# Patient Record
Sex: Female | Born: 1975 | Race: Black or African American | Hispanic: No | Smoking: Never smoker
Health system: Southern US, Community
[De-identification: ages and names within clinical notes are randomized; demographics above are authoritative.]

## PROBLEM LIST (undated history)

## (undated) DIAGNOSIS — T7840XA Allergy, unspecified, initial encounter: Secondary | ICD-10-CM

## (undated) DIAGNOSIS — E119 Type 2 diabetes mellitus without complications: Secondary | ICD-10-CM

## (undated) HISTORY — DX: Type 2 diabetes mellitus without complications: E11.9

## (undated) HISTORY — PX: TUBAL LIGATION: SHX77

## (undated) HISTORY — PX: ABLATION: SHX5711

## (undated) HISTORY — DX: Allergy, unspecified, initial encounter: T78.40XA

---

## 2005-01-18 ENCOUNTER — Other Ambulatory Visit: Admission: RE | Admit: 2005-01-18 | Discharge: 2005-01-18 | Payer: Self-pay

## 2006-01-03 ENCOUNTER — Encounter (INDEPENDENT_AMBULATORY_CARE_PROVIDER_SITE_OTHER): Payer: Self-pay | Admitting: Specialist

## 2006-01-03 ENCOUNTER — Other Ambulatory Visit: Admission: RE | Admit: 2006-01-03 | Discharge: 2006-01-03 | Payer: Self-pay | Admitting: Unknown Physician Specialty

## 2009-05-13 ENCOUNTER — Ambulatory Visit (HOSPITAL_COMMUNITY): Admission: RE | Admit: 2009-05-13 | Discharge: 2009-05-13 | Payer: Self-pay | Admitting: Obstetrics & Gynecology

## 2010-04-21 LAB — URINALYSIS, MICROSCOPIC ONLY
Bilirubin Urine: NEGATIVE
Hgb urine dipstick: NEGATIVE
Ketones, ur: NEGATIVE mg/dL
Protein, ur: NEGATIVE mg/dL
Urobilinogen, UA: 0.2 mg/dL (ref 0.0–1.0)

## 2010-04-21 LAB — COMPREHENSIVE METABOLIC PANEL
AST: 15 U/L (ref 0–37)
Albumin: 4 g/dL (ref 3.5–5.2)
Alkaline Phosphatase: 50 U/L (ref 39–117)
BUN: 12 mg/dL (ref 6–23)
CO2: 23 mEq/L (ref 19–32)
Chloride: 107 mEq/L (ref 96–112)
GFR calc Af Amer: >60 mL/min (ref >60)
Potassium: 3.8 mEq/L (ref 3.5–5.1)
Total Bilirubin: 0.4 mg/dL (ref 0.3–1.2)

## 2010-04-21 LAB — CBC
HCT: 36.6 % (ref 36.0–46.0)
Platelets: 282 10*3/uL (ref 150–400)
RBC: 4.36 MIL/uL (ref 3.87–5.11)
WBC: 9.4 10*3/uL (ref 4.0–10.5)

## 2010-04-21 LAB — DIFFERENTIAL
Basophils Absolute: 0 10*3/uL (ref 0.0–0.1)
Basophils Relative: 1 % (ref 0–1)
Eosinophils Absolute: 0 10*3/uL (ref 0.0–0.7)
Eosinophils Relative: 1 % (ref 0–5)
Monocytes Absolute: 0.5 10*3/uL (ref 0.1–1.0)

## 2010-04-21 LAB — GLUCOSE, CAPILLARY

## 2014-05-19 ENCOUNTER — Other Ambulatory Visit: Payer: Self-pay | Admitting: Women's Health

## 2017-10-27 DIAGNOSIS — E1140 Type 2 diabetes mellitus with diabetic neuropathy, unspecified: Secondary | ICD-10-CM | POA: Diagnosis not present

## 2017-10-27 DIAGNOSIS — R350 Frequency of micturition: Secondary | ICD-10-CM | POA: Diagnosis not present

## 2017-10-27 DIAGNOSIS — Z789 Other specified health status: Secondary | ICD-10-CM | POA: Diagnosis not present

## 2017-10-27 DIAGNOSIS — I10 Essential (primary) hypertension: Secondary | ICD-10-CM | POA: Diagnosis not present

## 2017-10-27 DIAGNOSIS — E1165 Type 2 diabetes mellitus with hyperglycemia: Secondary | ICD-10-CM | POA: Diagnosis not present

## 2017-10-27 DIAGNOSIS — Z299 Encounter for prophylactic measures, unspecified: Secondary | ICD-10-CM | POA: Diagnosis not present

## 2017-10-27 DIAGNOSIS — Z6827 Body mass index (BMI) 27.0-27.9, adult: Secondary | ICD-10-CM | POA: Diagnosis not present

## 2017-11-06 DIAGNOSIS — Z299 Encounter for prophylactic measures, unspecified: Secondary | ICD-10-CM | POA: Diagnosis not present

## 2017-11-06 DIAGNOSIS — I10 Essential (primary) hypertension: Secondary | ICD-10-CM | POA: Diagnosis not present

## 2017-11-06 DIAGNOSIS — Z6827 Body mass index (BMI) 27.0-27.9, adult: Secondary | ICD-10-CM | POA: Diagnosis not present

## 2017-11-06 DIAGNOSIS — E1165 Type 2 diabetes mellitus with hyperglycemia: Secondary | ICD-10-CM | POA: Diagnosis not present

## 2017-11-06 DIAGNOSIS — Z713 Dietary counseling and surveillance: Secondary | ICD-10-CM | POA: Diagnosis not present

## 2018-01-04 DIAGNOSIS — Z299 Encounter for prophylactic measures, unspecified: Secondary | ICD-10-CM | POA: Diagnosis not present

## 2018-01-04 DIAGNOSIS — E1165 Type 2 diabetes mellitus with hyperglycemia: Secondary | ICD-10-CM | POA: Diagnosis not present

## 2018-01-04 DIAGNOSIS — Z6827 Body mass index (BMI) 27.0-27.9, adult: Secondary | ICD-10-CM | POA: Diagnosis not present

## 2018-01-04 DIAGNOSIS — Z01419 Encounter for gynecological examination (general) (routine) without abnormal findings: Secondary | ICD-10-CM | POA: Diagnosis not present

## 2018-01-04 DIAGNOSIS — Z1331 Encounter for screening for depression: Secondary | ICD-10-CM | POA: Diagnosis not present

## 2018-01-04 DIAGNOSIS — I10 Essential (primary) hypertension: Secondary | ICD-10-CM | POA: Diagnosis not present

## 2018-01-04 DIAGNOSIS — Z0000 Encounter for general adult medical examination without abnormal findings: Secondary | ICD-10-CM | POA: Diagnosis not present

## 2018-01-04 DIAGNOSIS — Z79899 Other long term (current) drug therapy: Secondary | ICD-10-CM | POA: Diagnosis not present

## 2018-11-12 LAB — COMPREHENSIVE METABOLIC PANEL
GFR calc Af Amer: >60
GFR calc non Af Amer: >60

## 2018-11-13 LAB — BASIC METABOLIC PANEL
BUN: 16 (ref 4–21)
Chloride: 100 (ref 99–108)
Creatinine: 0.6 (ref <=1.1)
Glucose: 298
Potassium: 4.2 (ref 3.4–5.3)
Sodium: 133 — AB (ref 137–147)

## 2018-11-13 LAB — COMPREHENSIVE METABOLIC PANEL
Albumin: 4.4 (ref 3.5–5.0)
Calcium: 9.6 (ref 8.7–10.7)

## 2018-11-13 LAB — CBC AND DIFFERENTIAL
HCT: 43 (ref 36–46)
Hemoglobin: 13.8 (ref 12.0–16.0)
Neutrophils Absolute: 4
WBC: 6.8

## 2018-11-13 LAB — HEPATIC FUNCTION PANEL
ALT: 15 (ref 7–35)
AST: 12 — AB (ref 13–35)

## 2018-12-13 ENCOUNTER — Ambulatory Visit (INDEPENDENT_AMBULATORY_CARE_PROVIDER_SITE_OTHER): Payer: BC Managed Care – PPO | Admitting: Family Medicine

## 2018-12-13 ENCOUNTER — Telehealth: Payer: Self-pay

## 2018-12-13 ENCOUNTER — Encounter: Payer: Self-pay | Admitting: Family Medicine

## 2018-12-13 ENCOUNTER — Other Ambulatory Visit: Payer: Self-pay

## 2018-12-13 VITALS — BP 120/82 | HR 92 | Temp 98.4°F | Ht 66.0 in | Wt 166.2 lb

## 2018-12-13 DIAGNOSIS — E079 Disorder of thyroid, unspecified: Secondary | ICD-10-CM | POA: Diagnosis not present

## 2018-12-13 DIAGNOSIS — E118 Type 2 diabetes mellitus with unspecified complications: Secondary | ICD-10-CM

## 2018-12-13 DIAGNOSIS — IMO0002 Reserved for concepts with insufficient information to code with codable children: Secondary | ICD-10-CM | POA: Insufficient documentation

## 2018-12-13 DIAGNOSIS — Z794 Long term (current) use of insulin: Secondary | ICD-10-CM

## 2018-12-13 DIAGNOSIS — Z7689 Persons encountering health services in other specified circumstances: Secondary | ICD-10-CM | POA: Insufficient documentation

## 2018-12-13 DIAGNOSIS — E1165 Type 2 diabetes mellitus with hyperglycemia: Secondary | ICD-10-CM | POA: Insufficient documentation

## 2018-12-13 DIAGNOSIS — Z1231 Encounter for screening mammogram for malignant neoplasm of breast: Secondary | ICD-10-CM

## 2018-12-13 LAB — POCT URINALYSIS DIP (CLINITEK)
Bilirubin, UA: NEGATIVE
Blood, UA: NEGATIVE
Glucose, UA: 500 mg/dL — AB
Leukocytes, UA: NEGATIVE
Nitrite, UA: NEGATIVE
POC PROTEIN,UA: NEGATIVE
Spec Grav, UA: 1.025 (ref 1.010–1.025)
Urobilinogen, UA: 0.2 E.U./dL
pH, UA: 5 (ref 5.0–8.0)

## 2018-12-13 LAB — POCT GLYCOSYLATED HEMOGLOBIN (HGB A1C): Hemoglobin A1C: 12.1 % — AB (ref 4.0–5.6)

## 2018-12-13 MED ORDER — VALACYCLOVIR HCL 500 MG PO TABS: 500.0000 mg | ORAL_TABLET | Freq: Every day | ORAL | 11 refills | Status: DC

## 2018-12-13 MED ORDER — BLOOD GLUCOSE METER KIT: PACK | 5 refills | Status: AC

## 2018-12-13 NOTE — Patient Instructions (Addendum)
Endo referral Diabetic education referral Check glucose before meals, fasting and at bedtime mammogram

## 2018-12-13 NOTE — Progress Notes (Signed)
New Patient Office Visit  Subjective:  Patient ID: Brandi Oneill, female    DOB: 1975/10/23  Age: 43 y.o. MRN: [id number]  CC:  Chief Complaint  Patient presents with  . Establish Care  . Thyroid Problem  . Diabetes    HPI Brandi Oneill presents for UTI concern-diagnosis caused elevation of glucose at hospital-records unavailable Glucose elevated at [facility] Hyperthyroid concerning -recommended endo at hospital Diagnosis of DM-taking insulin daily-unable to tolerate other types of diabetic medication H/o genital herpes -would like daily medication  Past Medical History:  Diagnosis Date  . Allergy   . Diabetes mellitus without complication (HCC)     Family History  Problem Relation Age of Onset  . Diabetes Mother   . Diabetes Father   . Hypertension Father     Social History   Socioeconomic History  . Marital status: Single    Spouse name: Not on file  . Number of children: Not on file  . Years of education: Not on file  . Highest education level: Not on file  Occupational History  . Occupation: shipping & receiving  Social Needs  . Financial resource strain: Not on file  . Food insecurity    Worry: Not on file    Inability: Not on file  . Transportation needs    Medical: Not on file    Non-medical: Not on file  Tobacco Use  . Smoking status: Never Smoker  . Smokeless tobacco: Never Used  Substance and Sexual Activity  . Alcohol use: Not Currently  . Drug use: Not Currently  . Sexual activity: Yes  Lifestyle  . Physical activity    Days per week: Not on file    Minutes per session: Not on file  . Stress: Not on file  Relationships  . Social Musician on phone: Not on file    Gets together: Not on file    Attends religious service: Not on file    Active member of club or organization: Not on file    Attends meetings of clubs or organizations: Not on file    Relationship status: Not on file  . Intimate partner violence   Fear of current or ex partner: Not on file    Emotionally abused: Not on file    Physically abused: Not on file    Forced sexual activity: Not on file  Other Topics Concern  . Not on file  Social History Narrative  . Not on file    ROS Review of Systems  Constitutional: Positive for unexpected weight change.       On Victoza -rapid weight loss-  HENT: Negative.   Respiratory: Negative.   Cardiovascular: Positive for leg swelling.  Gastrointestinal: Positive for constipation.  Endocrine: Positive for cold intolerance, heat intolerance and polyphagia.       Difficulty regulating diabetes-using Lispro Kwik pen-takes 50units at bedtime  Pt was unable to tolerate metformin related diarrhea  Thyroid disease-unknown levels  Genitourinary: Positive for frequency.  Musculoskeletal: Positive for back pain.  Skin: Negative.        Episodic genital herpes-pt would like suppressive therapy  Allergic/Immunologic: Positive for environmental allergies.  Neurological: Negative for headaches.  Hematological: Negative.   Psychiatric/Behavioral: Negative.     Objective:   Today's Vitals: BP 120/82 (BP Location: Right Arm, Patient Position: Sitting, Cuff Size: Normal)   Pulse 92   Temp 98.4 F (36.9 C) (Oral)   Ht 5\' 6"  (1.676 m)  Wt 166 lb 3.2 oz (75.4 kg)   SpO2 96%   BMI 26.83 kg/m   Physical Exam Vitals signs reviewed.  Constitutional:      Appearance: Normal appearance.  HENT:     Head: Normocephalic and atraumatic.     Right Ear: Tympanic membrane, ear canal and external ear normal.     Left Ear: Tympanic membrane, ear canal and external ear normal.     Nose: Nose normal.     Mouth/Throat:     Mouth: Mucous membranes are moist.  Eyes:     Conjunctiva/sclera: Conjunctivae normal.  Neck:     Musculoskeletal: Normal range of motion and neck supple.  Cardiovascular:     Rate and Rhythm: Normal rate and regular rhythm.     Pulses: Normal pulses.     Heart sounds: Normal  heart sounds.  Pulmonary:     Effort: Pulmonary effort is normal.     Breath sounds: Normal breath sounds.  Abdominal:     General: Bowel sounds are normal.  Neurological:     Mental Status: She is alert and oriented to person, place, and time.  Psychiatric:        Behavior: Behavior normal.     Assessment & Plan:   1. Controlled type 2 diabetes mellitus with complication, with long-term current use of insulin (HCC) Pt was unable to tolerate metformin due to diarrhea and Victoza due to rapid weight loss. Pt would like referral for nutritional assistance and endo for management - Ambulatory referral to Endocrinology 1. Controlled type 2 diabetes mellitus with complication, with long-term current use of insulin (HCC) Glucose monitoring suggested ac meals and bedtime-pt has a monitor-encouraged her to check insurance for monitor of choice to decrease cost - Ambulatory referral to Endocrinology - Referral to Nutrition and Diabetes Services - POCT URINALYSIS DIP (CLINITEK) - Lipid panel - POCT HgB A1C  2. Thyroid disease Endo-no records available from hospital-concern for rapid weight loss in the past-labs pending - COMPLETE METABOLIC PANEL WITH GFR - TSH+T4F+T3Free  3. Breast cancer screening by mammogram - MM Digital Screening; Future Outpatient Encounter Medications as of 12/13/2018  Medication Sig  . insulin lispro (HUMALOG) 100 UNIT/ML injection Inject into the skin 3 (three) times daily before meals.  . metFORMIN (GLUCOPHAGE) 500 MG tablet Take by mouth 2 (two) times daily with a meal.   No facility-administered encounter medications on file as of 12/13/2018.   Marland Kitchendai  Follow-up: 1 month-after labwork, endo appt  LISA Hannah Beat, MD

## 2018-12-13 NOTE — Telephone Encounter (Signed)
Brandi Oneill, CMA  

## 2018-12-25 ENCOUNTER — Other Ambulatory Visit (HOSPITAL_COMMUNITY): Payer: Self-pay | Admitting: Family Medicine

## 2018-12-25 DIAGNOSIS — Z1231 Encounter for screening mammogram for malignant neoplasm of breast: Secondary | ICD-10-CM

## 2018-12-31 ENCOUNTER — Other Ambulatory Visit: Payer: Self-pay

## 2018-12-31 ENCOUNTER — Ambulatory Visit (HOSPITAL_COMMUNITY)
Admission: RE | Admit: 2018-12-31 | Discharge: 2018-12-31 | Disposition: A | Discharge status: Home / Self Care | Payer: BC Managed Care – PPO | Source: Ambulatory Visit | Attending: Family Medicine | Admitting: Family Medicine

## 2018-12-31 DIAGNOSIS — Z1231 Encounter for screening mammogram for malignant neoplasm of breast: Secondary | ICD-10-CM | POA: Diagnosis not present

## 2019-01-01 ENCOUNTER — Other Ambulatory Visit (HOSPITAL_COMMUNITY): Payer: Self-pay | Admitting: Family Medicine

## 2019-01-01 DIAGNOSIS — R928 Other abnormal and inconclusive findings on diagnostic imaging of breast: Secondary | ICD-10-CM

## 2019-01-04 ENCOUNTER — Telehealth: Payer: Self-pay | Admitting: Family Medicine

## 2019-01-04 NOTE — Telephone Encounter (Signed)
Pt is calling and states she is having severe back pain and missed work today. Patient would like advice if this is something dr Holly Bodily can treat or does she need to be referred out.

## 2019-01-07 ENCOUNTER — Ambulatory Visit: Payer: Self-pay | Admitting: "Endocrinology

## 2019-01-07 NOTE — Telephone Encounter (Signed)
Routing to Dr. Corum for advice ? 

## 2019-01-07 NOTE — Telephone Encounter (Signed)
Tele-med visit to discuss

## 2019-01-08 ENCOUNTER — Telehealth (INDEPENDENT_AMBULATORY_CARE_PROVIDER_SITE_OTHER): Payer: BC Managed Care – PPO | Admitting: Family Medicine

## 2019-01-08 ENCOUNTER — Encounter: Payer: Self-pay | Admitting: Family Medicine

## 2019-01-08 ENCOUNTER — Other Ambulatory Visit: Payer: Self-pay

## 2019-01-08 ENCOUNTER — Ambulatory Visit (HOSPITAL_COMMUNITY)
Admission: RE | Admit: 2019-01-08 | Discharge: 2019-01-08 | Disposition: A | Discharge status: Home / Self Care | Payer: BC Managed Care – PPO | Source: Ambulatory Visit | Attending: Family Medicine | Admitting: Family Medicine

## 2019-01-08 ENCOUNTER — Other Ambulatory Visit (HOSPITAL_COMMUNITY): Payer: BC Managed Care – PPO

## 2019-01-08 DIAGNOSIS — M545 Low back pain, unspecified: Secondary | ICD-10-CM

## 2019-01-08 DIAGNOSIS — G8929 Other chronic pain: Secondary | ICD-10-CM | POA: Diagnosis not present

## 2019-01-08 DIAGNOSIS — R928 Other abnormal and inconclusive findings on diagnostic imaging of breast: Secondary | ICD-10-CM | POA: Insufficient documentation

## 2019-01-08 DIAGNOSIS — N6320 Unspecified lump in the left breast, unspecified quadrant: Secondary | ICD-10-CM | POA: Diagnosis not present

## 2019-01-08 MED ORDER — CYCLOBENZAPRINE HCL 10 MG PO TABS: 10.0000 mg | ORAL_TABLET | Freq: Every day | ORAL | 5 refills | Status: DC

## 2019-01-08 NOTE — Patient Instructions (Signed)
Voltaren gel Pick up flexeril at [place] twice a day with food Lumbar xrays -order sent to [facility]

## 2019-01-08 NOTE — Progress Notes (Signed)
Virtual Visit via Telephone Note  I connected with Brandi Oneill on 01/08/19 at  1:40 PM EST by telephone and verified that I am speaking with the correct person using two identifiers.  Location: Patient: home Provider: clinic   I discussed the limitations, risks, security and privacy concerns of performing an evaluation and management service by telephone and the availability of in person appointments. I also discussed with the patient that there may be a patient responsible charge related to this service. The patient expressed understanding and agreed to proceed.   History of Present Illness: Pt with lower back pain episodically for 10 years. Pt states no prior evaluation. No radiation. 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 Pt states she did a bearhug lift and pt state she hurt her back.  Pt states walking affected. Pt with no bathroom concerns.  Pt has taken Aleve and used a heating pad.  Pt used biofreeze.    Observations/Objective:  No current vitals Assessment and Plan:  1. Chronic midline low back pain without sciatica Recommend xray for evaluation since no prior xrays of lower back and worsening symptoms - DG Lumbar Spine Complete; Future Flexeril-rx voltaren gel Aleve-with food Follow Up Instructions: Will call with results of xray   I discussed the assessment and treatment plan with the patient. The patient was provided an opportunity to ask questions and all were answered. The patient agreed with the plan and demonstrated an understanding of the instructions.   The patient was advised to call back or seek an in-person evaluation if the symptoms worsen or if the condition fails to improve as anticipated.  I provided 10. minutes of non-face-to-face time during this encounter.   LISA Hannah Beat, MD

## 2019-01-08 NOTE — Telephone Encounter (Signed)
Routing to [employer] for a Telemed visit

## 2019-01-10 ENCOUNTER — Other Ambulatory Visit: Payer: Self-pay

## 2019-01-10 ENCOUNTER — Ambulatory Visit (HOSPITAL_COMMUNITY)
Admission: RE | Admit: 2019-01-10 | Discharge: 2019-01-10 | Disposition: A | Discharge status: Home / Self Care | Payer: BC Managed Care – PPO | Source: Ambulatory Visit | Attending: Family Medicine | Admitting: Family Medicine

## 2019-01-10 DIAGNOSIS — G8929 Other chronic pain: Secondary | ICD-10-CM | POA: Diagnosis not present

## 2019-01-10 DIAGNOSIS — M545 Low back pain, unspecified: Secondary | ICD-10-CM

## 2019-01-13 DIAGNOSIS — G8929 Other chronic pain: Secondary | ICD-10-CM | POA: Insufficient documentation

## 2019-01-13 DIAGNOSIS — M545 Low back pain: Secondary | ICD-10-CM | POA: Insufficient documentation

## 2019-01-14 ENCOUNTER — Other Ambulatory Visit: Payer: Self-pay

## 2019-01-14 ENCOUNTER — Telehealth: Payer: Self-pay | Admitting: Family Medicine

## 2019-01-14 ENCOUNTER — Telehealth (INDEPENDENT_AMBULATORY_CARE_PROVIDER_SITE_OTHER): Payer: BC Managed Care – PPO | Admitting: Family Medicine

## 2019-01-14 DIAGNOSIS — E079 Disorder of thyroid, unspecified: Secondary | ICD-10-CM

## 2019-01-14 DIAGNOSIS — E118 Type 2 diabetes mellitus with unspecified complications: Secondary | ICD-10-CM

## 2019-01-14 DIAGNOSIS — Z794 Long term (current) use of insulin: Secondary | ICD-10-CM

## 2019-01-14 NOTE — Telephone Encounter (Signed)
Routing to Dr. Corum 

## 2019-01-14 NOTE — Progress Notes (Signed)
Virtual Visit via Telephone Note  I connected with Brandi Oneill on 01/14/19 at  2:40 PM EST by telephone and verified that I am speaking with the correct person using two identifiers. DOB/address  Location: Patient: home Provider: clinic   I discussed the limitations, risks, security and privacy concerns of performing an evaluation and management service by telephone and the availability of in person appointments. I also discussed with the patient that there may be a patient responsible charge related to this service. The patient expressed understanding and agreed to proceed.   History of Present Illness:   DM-difficulty keeping glucose under control.  Pt taking metformin BID and Humalog.   Observations/Objective: Glucose 157, 67-fasting at home  Assessment and Plan: 1. Controlled type 2 diabetes mellitus with complication, with long-term current use of insulin (HCC)-poor controlled with A1c-needs endocrinologist and dietitian. Fasting glucose readings  2. Thyroid disease Needs labwork-TSH  Follow Up Instructions: Endo/diabetic education   I discussed the assessment and treatment plan with the patient. The patient was provided an opportunity to ask questions and all were answered. The patient agreed with the plan and demonstrated an understanding of the instructions.   The patient was advised of uncontrolled diabetes and need for referral to endo. Continue glucose reading fasting and prior to meals  I provided 10 minutes of non-face-to-face time during this encounter.   Emari Hreha Hannah Beat, MD

## 2019-01-14 NOTE — Telephone Encounter (Signed)
Pt is calling to let her doctor office know she tested positive for covid on Friday. She has a phone visit today for her 1 month follow up.

## 2019-01-28 ENCOUNTER — Ambulatory Visit: Payer: BC Managed Care – PPO | Admitting: Nutrition

## 2019-02-04 ENCOUNTER — Encounter: Payer: Self-pay | Admitting: Nutrition

## 2019-02-04 ENCOUNTER — Other Ambulatory Visit: Payer: Self-pay

## 2019-02-04 ENCOUNTER — Encounter: Payer: BC Managed Care – PPO | Attending: Family Medicine | Admitting: Nutrition

## 2019-02-04 ENCOUNTER — Ambulatory Visit (INDEPENDENT_AMBULATORY_CARE_PROVIDER_SITE_OTHER): Payer: BC Managed Care – PPO | Admitting: "Endocrinology

## 2019-02-04 ENCOUNTER — Encounter: Payer: Self-pay | Admitting: "Endocrinology

## 2019-02-04 VITALS — BP 137/92 | HR 102 | Ht 66.0 in | Wt 168.8 lb

## 2019-02-04 VITALS — Ht 66.0 in | Wt 168.8 lb

## 2019-02-04 DIAGNOSIS — E118 Type 2 diabetes mellitus with unspecified complications: Secondary | ICD-10-CM

## 2019-02-04 DIAGNOSIS — I10 Essential (primary) hypertension: Secondary | ICD-10-CM | POA: Diagnosis not present

## 2019-02-04 DIAGNOSIS — Z794 Long term (current) use of insulin: Secondary | ICD-10-CM | POA: Diagnosis not present

## 2019-02-04 DIAGNOSIS — E1165 Type 2 diabetes mellitus with hyperglycemia: Secondary | ICD-10-CM | POA: Diagnosis not present

## 2019-02-04 DIAGNOSIS — IMO0002 Reserved for concepts with insufficient information to code with codable children: Secondary | ICD-10-CM

## 2019-02-04 MED ORDER — TRESIBA FLEXTOUCH 100 UNIT/ML ~~LOC~~ SOPN: 60.0000 [IU] | PEN_INJECTOR | Freq: Every day | SUBCUTANEOUS | 2 refills | Status: DC

## 2019-02-04 MED ORDER — METFORMIN HCL ER 500 MG PO TB24: 500.0000 mg | ORAL_TABLET | Freq: Every day | ORAL | 3 refills | Status: DC

## 2019-02-04 NOTE — Progress Notes (Signed)
Endocrinology Consult Note       02/04/2019, 6:31 PM   Subjective:    Patient ID: Brandi Oneill, female    DOB: 07-Sep-1975.  Brandi Oneill is being seen in consultation for management of currently uncontrolled symptomatic diabetes requested by  Maryruth Hancock, MD.   Past Medical History:  Diagnosis Date  . Allergy   . Diabetes mellitus without complication ([place])     History reviewed. No pertinent surgical history.  Social History   Socioeconomic History  . Marital status: Single    Spouse name: Not on file  . Number of children: Not on file  . Years of education: Not on file  . Highest education level: Not on file  Occupational History  . Occupation: shipping & receiving  Tobacco Use  . Smoking status: Never Smoker  . Smokeless tobacco: Never Used  Substance and Sexual Activity  . Alcohol use: Not Currently  . Drug use: Not Currently  . Sexual activity: Yes  Other Topics Concern  . Not on file  Social History Narrative  . Not on file   Social Determinants of Health   Financial Resource Strain:   . Difficulty of Paying Living Expenses: Not on file  Food Insecurity:   . Worried About Charity fundraiser in the Last Year: Not on file  . Ran Out of Food in the Last Year: Not on file  Transportation Needs:   . Lack of Transportation (Medical): Not on file  . Lack of Transportation (Non-Medical): Not on file  Physical Activity:   . Days of Exercise per Week: Not on file  . Minutes of Exercise per Session: Not on file  Stress:   . Feeling of Stress : Not on file  Social Connections:   . Frequency of Communication with Friends and Family: Not on file  . Frequency of Social Gatherings with Friends and Family: Not on file  . Attends Religious Services: Not on file  . Active Member of Clubs or Organizations: Not on file  . Attends Archivist Meetings: Not on file  . Marital  Status: Not on file    Family History  Problem Relation Age of Onset  . Diabetes Mother   . Diabetes Father   . Hypertension Father     Outpatient Encounter Medications as of 02/04/2019  Medication Sig  . blood glucose meter kit and supplies Check blood glucose fasting, before lunch, before dinner and at bedtime  . cyclobenzaprine (FLEXERIL) 10 MG tablet Take 1 tablet (10 mg total) by mouth at bedtime.  . insulin degludec (TRESIBA FLEXTOUCH) 100 UNIT/ML SOPN FlexTouch Pen Inject 0.6 mLs (60 Units total) into the skin daily.  . insulin lispro (HUMALOG) 100 UNIT/ML injection Inject 1 Units into the skin 3 (three) times daily before meals.  . metFORMIN (GLUCOPHAGE XR) 500 MG 24 hr tablet Take 1 tablet (500 mg total) by mouth daily with breakfast.  . valACYclovir (VALTREX) 500 MG tablet Take 1 tablet (500 mg total) by mouth daily.  . [DISCONTINUED] metFORMIN (GLUCOPHAGE) 500 MG tablet Take by mouth 2 (two) times daily with a meal.  No facility-administered encounter medications on file as of 02/04/2019.    ALLERGIES: Not on File  VACCINATION STATUS:  There is no immunization history on file for this patient.  Diabetes She presents for her initial diabetic visit. She has type 2 diabetes mellitus. Onset time: She was diagnosed at approximate age of. Her disease course has been worsening. There are no hypoglycemic associated symptoms. Pertinent negatives for hypoglycemia include no confusion, headaches, pallor or seizures. Associated symptoms include fatigue, polydipsia and polyuria. Pertinent negatives for diabetes include no chest pain and no polyphagia. There are no hypoglycemic complications. Symptoms are worsening. There are no diabetic complications. Risk factors for coronary artery disease include diabetes mellitus and family history. Current diabetic treatments: She is currently on Humalog 15 units 3 times daily AC. Her weight is fluctuating minimally. She is following a generally  unhealthy diet. When asked about meal planning, she reported none. She has not had a previous visit with a dietitian. She participates in exercise intermittently. Her home blood glucose trend is increasing steadily. (She brought in a meter showing average blood glucose of 204 for the last 14 days.  Her recent A1c was 12.1%.) An ACE inhibitor/angiotensin II receptor blocker is not being taken. Eye exam is not current.     Review of Systems  Constitutional: Positive for fatigue. Negative for chills, fever and unexpected weight change.  HENT: Negative for trouble swallowing and voice change.   Eyes: Negative for visual disturbance.  Respiratory: Negative for cough, shortness of breath and wheezing.   Cardiovascular: Negative for chest pain, palpitations and leg swelling.  Gastrointestinal: Negative for diarrhea, nausea and vomiting.  Endocrine: Positive for polydipsia and polyuria. Negative for cold intolerance, heat intolerance and polyphagia.  Musculoskeletal: Negative for arthralgias and myalgias.  Skin: Negative for color change, pallor, rash and wound.  Neurological: Negative for seizures and headaches.  Psychiatric/Behavioral: Negative for confusion and suicidal ideas.    Objective:    BP (!) 137/92   Pulse (!) 102   Ht 5' 6"  (1.676 m)   Wt 168 lb 12.8 oz (76.6 kg)   BMI 27.25 kg/m   Wt Readings from Last 3 Encounters:  02/04/19 168 lb 12.8 oz (76.6 kg)  02/04/19 168 lb 12.8 oz (76.6 kg)  12/13/18 166 lb 3.2 oz (75.4 kg)     Physical Exam Constitutional:      Appearance: She is well-developed.  HENT:     Head: Normocephalic and atraumatic.  Neck:     Thyroid: No thyromegaly.     Trachea: No tracheal deviation.  Cardiovascular:     Rate and Rhythm: Normal rate.  Pulmonary:     Effort: Pulmonary effort is normal.  Abdominal:     Tenderness: There is no abdominal tenderness. There is no guarding.  Musculoskeletal:        General: Normal range of motion.     Cervical  back: Normal range of motion and neck supple.  Skin:    General: Skin is warm and dry.     Coloration: Skin is not pale.     Findings: No erythema or rash.  Neurological:     Mental Status: She is alert and oriented to person, place, and time.     Cranial Nerves: No cranial nerve deficit.     Coordination: Coordination normal.     Deep Tendon Reflexes: Reflexes are normal and symmetric.  Psychiatric:        Judgment: Judgment normal.       CMP (  most recent) CMP     Component Value Date/Time   NA 133 (A) 11/13/2018 0000   K 4.2 11/13/2018 0000   CL 100 11/13/2018 0000   CO2 23 05/11/2009 1551   GLUCOSE 82 05/11/2009 1551   BUN 16 11/13/2018 0000   CREATININE 0.6 11/13/2018 0000   CREATININE 0.77 05/11/2009 1551   CALCIUM 9.6 11/13/2018 0000   PROT 7.2 05/11/2009 1551   ALBUMIN 4.4 11/13/2018 0000   AST 12 (A) 11/13/2018 0000   ALT 15 11/13/2018 0000   ALKPHOS 50 05/11/2009 1551   BILITOT 0.4 05/11/2009 1551   GFRNONAA >60 ml/min 11/12/2018 0000   GFRAA >60 ml/min 11/12/2018 0000     Diabetic Labs (most recent): Lab Results  Component Value Date   HGBA1C 12.1 (A) 12/13/2018     Assessment & Plan:   1. uncontrolled type 2 diabetes - Brandi Oneill has currently uncontrolled symptomatic type 2 DM since  44 years of age,  with most recent A1c of 12.1 %. Recent labs reviewed. - I had a long discussion with her about the progressive nature of diabetes and the pathology behind its complications.  Her meter average shows 204 over the last 14 days. -She does not report gross complications, however she remains at a high risk for more acute and chronic complications which include CAD, CVA, CKD, retinopathy, and neuropathy. These are all discussed in detail with her.  - I have counseled her on diet  and weight management  by adopting a carbohydrate restricted/protein rich diet. Patient is encouraged to switch to  unprocessed or minimally processed     complex starch and  increased protein intake (animal or plant source), fruits, and vegetables. -  she is advised to stick to a routine mealtimes to eat 3 meals  a day and avoid unnecessary snacks ( to snack only to correct hypoglycemia).   - she admits that there is a room for improvement in her food and drink choices. - Suggestion is made for her to avoid simple carbohydrates  from her diet including Cakes, Sweet Desserts, Ice Cream, Soda (diet and regular), Sweet Tea, Candies, Chips, Cookies, Store Bought Juices, Alcohol in Excess of  1-2 drinks a day, Artificial Sweeteners,  Coffee Creamer, and "Sugar-free" Products. This will help patient to have more stable blood glucose profile and potentially avoid unintended weight gain.  - she will be scheduled with Jearld Fenton, RDN, CDE for diabetes education.  - I have approached her with the following individualized plan to manage  her diabetes and patient agrees:   -Given her current glycemic burden, she will continue to need insulin treatment in order for her to achieve control of diabetes to target.  However, she would benefit from introduction and titration of basal insulin before considering prandial insulin.    -I discussed and initiated Tresiba 60 units nightly, hold Humalog for now, continue to monitor blood glucose strictly   4 times a day-before meals and at bedtime and return in 10 days for reevaluation. - she is warned not to take insulin without proper monitoring per orders.  - she is encouraged to call clinic for blood glucose levels less than 70 or above 300 mg /dl. - she did not tolerate Metformin 1000 mg p.o. twice daily, she is willing to consider low-dose Metformin.  I discussed and reinitiated metformin 500 mg XR p.o. daily after breakfast.    - Specific targets for  A1c;  LDL, HDL,  and Triglycerides were discussed with  the patient.  2) Blood Pressure /Hypertension:  her blood pressure is not controlled to target.  He says she never had problem  with her blood pressure and wishes to avoid medication at this time.  She will be considered for low-dose ACE inhibitor's if her blood pressure remains above 130/80 on subsequent visits.  3) Lipids/Hyperlipidemia: No recent lipid panel to review.  She is not on statin.  She will be considered for positive panel on subsequent visits.   4)  Weight/Diet:  Body mass index is 27.25 kg/m.  -  she is not a candidate for weight loss.  I discussed with her the fact that loss of 5 - 10% of her  current body weight will have the most impact on her diabetes management.  Exercise, and detailed carbohydrates information provided  -  detailed on discharge instructions.  5) Chronic Care/Health Maintenance:  -she  Is not on ACEI/ARB and Statin medications and  is encouraged to initiate and continue to follow up with Ophthalmology, Dentist,  Podiatrist at least yearly or according to recommendations, and advised to  stay away from smoking. I have recommended yearly flu vaccine and pneumonia vaccine at least every 5 years; moderate intensity exercise for up to 150 minutes weekly; and  sleep for at least 7 hours a day.  - she is  advised to maintain close follow up with Corum, Rex Kras, MD for primary care needs, as well as her other providers for optimal and coordinated care.   - Time spent in this patient care: 60 min, of which > 50% was spent in  counseling  her about his currently uncontrolled type 2 diabetes, elevated blood pressure and the rest reviewing her blood glucose logs , discussing her hypoglycemia and hyperglycemia episodes, reviewing her current and  previous labs / studies  ( including abstraction from other facilities) and medications  doses and developing a  long term treatment plan based on the latest standards of care/ guidelines; and documenting her care.    Please refer to Patient Instructions for Blood Glucose Monitoring and Insulin/Medications Dosing Guide"  in media tab for additional  information. Please  also refer to " Patient Self Inventory" in the Media  tab for reviewed elements of pertinent patient history.  Brandi Oneill participated in the discussions, expressed understanding, and voiced agreement with the above plans.  All questions were answered to her satisfaction. she is encouraged to contact clinic should she have any questions or concerns prior to her return visit.   Follow up plan: - Return in about 10 days (around 02/14/2019) for Follow up with Meter and Logs Only - no Labs.  Glade Lloyd, MD [facility] [address] [place], [place] [address] Phone: [phone]  Fax: [phone]    02/04/2019, 6:31 PM  This note was partially dictated with voice recognition software. Similar sounding words can be transcribed inadequately or may not  be corrected upon review.

## 2019-02-04 NOTE — Patient Instructions (Signed)

## 2019-02-04 NOTE — Progress Notes (Signed)
  Medical Nutrition Therapy:  Appt start time: 1500 end time:  1600.   Assessment:  Primary concerns today:  Diabetes Type 2. Here to see Dr. Fransico Him, Endocrinology for the first time also. Reports she has had conflicting information for the last 15 yrs trying to manage her DM. She has been losing weight but now gaining some.  Just started seeing Dr. Dorette Grate for PCP. DM has not been well controlled. Metformin 500 mg BID, Humalog with meals. Saw Dr. Fransico Him today and was put on Tresiba  20 units a day and stopped the Humalog with meals for now. She agreed with the plan. Metformin reduced to 50 mg once a day due to GI issues in past with Metformin. Complains of frequent urination, fatigue, blurry vision and no energy. Has been avoiding carbs. Current diet is inconsistent to meet her needs. She is willing to work on eating better balanced meals, walk and improve her DM.   Lab Results  Component Value Date   HGBA1C 12.1 (A) 12/13/2018   CMP Latest Ref Rng & Units 11/13/2018 05/11/2009  Glucose 70 - 99 mg/dL - 82  BUN 4 - 21 16 12   Creatinine 0.5 - 1.1 0.6 0.77  Sodium 137 - 147 133(A) 137  Potassium 3.4 - 5.3 4.2 3.8  Chloride 99 - 108 100 107  CO2 19 - 32 mEq/L - 23  Calcium 8.7 - 10.7 9.6 9.8  Total Protein 6.0 - 8.3 g/dL - 7.2  Total Bilirubin 0.3 - 1.2 mg/dL - 0.4  Alkaline Phos 39 - 117 U/L - 50  AST 13 - 35 12(A) 15  ALT 7 - 35 15 16     Preferred Learning Style:   No preference indicated   Learning Readiness:   Ready  Change in progress   MEDICATIONS:    DIETARY INTAKE:  Eats 2-3 meals per day; not hungry at times.  Usual physical activity: ADL  Estimated energy needs: 1500  calories 170-112 g carbohydrates 112 g protein 42 g fat  Progress Towards Goal(s):  In progress.   Nutritional Diagnosis:  NB-1.1 Food and nutrition-related knowledge deficit As related to Diabetes Type 2, .  As evidenced by A1C > 12%.    Intervention:  Nutrition and Diabetes education  provided on My Plate, CHO counting, meal planning, portion sizes, timing of meals, avoiding snacks between meals unless having a low blood sugar, target ranges for A1C and blood sugars, signs/symptoms and treatment of hyper/hypoglycemia, monitoring blood sugars, taking medications as prescribed, benefits of exercising 30 minutes per day and prevention of complications of DM.  Goals Follow My Plate Eat three meals on time Cut out snacks Drink only water Take meds as prescribed. Test blood sugars 4 times per day. .  Teaching Method Utilized:  Visual Auditory Hands on  Handouts given during visit include:  The Plate Method   Meal Plan Card  Diabetes Instructions   Barriers to learning/adherence to lifestyle change: none  Demonstrated degree of understanding via:  Teach Back   Monitoring/Evaluation:  Dietary intake, exercise, , and body weight in 1 month(s).

## 2019-02-06 ENCOUNTER — Encounter: Payer: Self-pay | Admitting: Nutrition

## 2019-02-06 NOTE — Patient Instructions (Signed)
Goals Follow My Plate Eat three meals on time Cut out snacks Drink only water Take meds as prescribed. Test blood sugars 4 times per day.

## 2019-02-18 ENCOUNTER — Ambulatory Visit (INDEPENDENT_AMBULATORY_CARE_PROVIDER_SITE_OTHER): Payer: BC Managed Care – PPO | Admitting: "Endocrinology

## 2019-02-18 ENCOUNTER — Telehealth: Payer: BC Managed Care – PPO | Admitting: Nutrition

## 2019-02-18 ENCOUNTER — Encounter: Payer: Self-pay | Admitting: "Endocrinology

## 2019-02-18 DIAGNOSIS — Z794 Long term (current) use of insulin: Secondary | ICD-10-CM | POA: Diagnosis not present

## 2019-02-18 DIAGNOSIS — E118 Type 2 diabetes mellitus with unspecified complications: Secondary | ICD-10-CM | POA: Diagnosis not present

## 2019-02-18 DIAGNOSIS — Z6827 Body mass index (BMI) 27.0-27.9, adult: Secondary | ICD-10-CM

## 2019-02-18 DIAGNOSIS — I10 Essential (primary) hypertension: Secondary | ICD-10-CM | POA: Diagnosis not present

## 2019-02-18 DIAGNOSIS — E785 Hyperlipidemia, unspecified: Secondary | ICD-10-CM

## 2019-02-18 MED ORDER — TRESIBA FLEXTOUCH 100 UNIT/ML ~~LOC~~ SOPN: 60.0000 [IU] | PEN_INJECTOR | Freq: Every day | SUBCUTANEOUS | 2 refills | Status: DC

## 2019-02-18 MED ORDER — BD PEN NEEDLE SHORT U/F 31G X 8 MM MISC: 1.0000 | 3 refills | Status: DC

## 2019-02-18 NOTE — Patient Instructions (Signed)
                                     Advice for Weight Management  -For most of us the best way to lose weight is by diet management. Generally speaking, diet management means consuming less calories intentionally which over time brings about progressive weight loss.  This can be achieved more effectively by restricting carbohydrate consumption to the minimum possible.  So, it is critically important to know your numbers: how much calorie you are consuming and how much calorie you need. More importantly, our carbohydrates sources should be unprocessed or minimally processed complex starch food items.   Sometimes, it is important to balance nutrition by increasing protein intake (animal or plant source), fruits, and vegetables.  -Sticking to a routine mealtime to eat 3 meals a day and avoiding unnecessary snacks is shown to have a big role in weight control. Under normal circumstances, the only time we lose real weight is when we are hungry, so allow hunger to take place- hunger means no food between meal times, only water.  It is not advisable to starve.   -It is better to avoid simple carbohydrates including: Cakes, Sweet Desserts, Ice Cream, Soda (diet and regular), Sweet Tea, Candies, Chips, Cookies, Store Bought Juices, Alcohol in Excess of  1-2 drinks a day, Artificial Sweeteners, Doughnuts, Coffee Creamers, "Sugar-free" Products, etc, etc.  This is not a complete list.....    -Consulting with certified diabetes educators is proven to provide you with the most accurate and current information on diet.  Also, you may be  interested in discussing diet options/exchanges , we can schedule a visit with Penny Crumpton, RDN, CDE for individualized nutrition education.  -Exercise: If you are able: 30 -60 minutes a day ,4 days a week, or 150 minutes a week.  The longer the better.  Combine stretch, strength, and aerobic activities.  If you were told in the past that you  have high risk for cardiovascular diseases, you may seek evaluation by your heart doctor prior to initiating moderate to intense exercise programs.                                  Additional Care Considerations for Diabetes   -Diabetes  is a chronic disease.  The most important care consideration is regular follow-up with your diabetes care provider with the goal being avoiding or delaying its complications and to take advantage of advances in medications and technology.    -Type 2 diabetes is known to coexist with other important comorbidities such as high blood pressure and high cholesterol.  It is critical to control not only the diabetes but also the high blood pressure and high cholesterol to minimize and delay the risk of complications including coronary artery disease, stroke, amputations, blindness, etc.    - Studies showed that people with diabetes will benefit from a class of medications known as ACE inhibitors and statins.  Unless there are specific reasons not to be on these medications, the standard of care is to consider getting one from these groups of medications at an optimal doses.  These medications are generally considered safe and proven to help protect the heart and the kidneys.    - People with diabetes are encouraged to initiate and maintain regular follow-up with eye doctors, foot doctors, dentists ,   and if necessary heart and kidney doctors.     - It is highly recommended that people with diabetes quit smoking or stay away from smoking, and get yearly  flu vaccine and pneumonia vaccine at least every 5 years.  One other important lifestyle recommendation is to ensure adequate sleep - at least 6-7 hours of uninterrupted sleep at night.  -Exercise: If you are able: 30 -60 minutes a day, 4 days a week, or 150 minutes a week.  The longer the better.  Combine stretch, strength, and aerobic activities.  If you were told in the past that you have high risk for cardiovascular  diseases, you may seek evaluation by your heart doctor prior to initiating moderate to intense exercise programs.     COVID-19 Vaccine Information can be found at: [email or web address] For questions related to vaccine distribution or appointments, please email [email or web address] or call [phone].        

## 2019-02-18 NOTE — Progress Notes (Addendum)
02/18/2019, 7:27 PM                                                    Endocrinology Telehealth Visit Follow up Note -During COVID -19 Pandemic  This visit type was conducted due to national recommendations for restrictions regarding the COVID-19 Pandemic  in an effort to limit this patient's exposure and mitigate transmission of the corona virus.  Due to her co-morbid illnesses, Brandi Oneill is at  moderate to high risk for complications without adequate follow up.  This format is felt to be most appropriate for her at this time.  I connected with this patient on 03/06/2019   by telephone and verified that I am speaking with the correct person using two identifiers. Brandi Oneill, Oct 22, 1975. she has verbally consented to this visit. All issues noted in this document were discussed and addressed. The format was not optimal for physical exam.    Subjective:    Patient ID: [name], female    DOB: 11-23-1975.  Brandi Oneill is being engaged in telehealth via telephone  for management of currently uncontrolled symptomatic diabetes requested by  Maryruth Hancock, MD.   Past Medical History:  Diagnosis Date  . Allergy   . Diabetes mellitus without complication ([place])     History reviewed. No pertinent surgical history.  Social History   Socioeconomic History  . Marital status: Single    Spouse name: Not on file  . Number of children: Not on file  . Years of education: Not on file  . Highest education level: Not on file  Occupational History  . Occupation: shipping & receiving  Tobacco Use  . Smoking status: Never Smoker  . Smokeless tobacco: Never Used  Substance and Sexual Activity  . Alcohol use: Not Currently  . Drug use: Not Currently  . Sexual activity: Yes  Other Topics Concern  . Not on file  Social History Narrative  . Not on file   Social Determinants of Health   Financial  Resource Strain:   . Difficulty of Paying Living Expenses: Not on file  Food Insecurity:   . Worried About Charity fundraiser in the Last Year: Not on file  . Ran Out of Food in the Last Year: Not on file  Transportation Needs:   . Lack of Transportation (Medical): Not on file  . Lack of Transportation (Non-Medical): Not on file  Physical Activity:   . Days of Exercise per Week: Not on file  . Minutes of Exercise per Session: Not on file  Stress:   . Feeling of Stress : Not on file  Social Connections:   . Frequency of Communication with Friends and Family: Not on file  . Frequency of Social Gatherings with Friends and Family: Not on file  . Attends Religious Services: Not on file  . Active Member of Clubs or Organizations: Not on file  . Attends Archivist Meetings: Not  on file  . Marital Status: Not on file    Family History  Problem Relation Age of Onset  . Diabetes Mother   . Diabetes Father   . Hypertension Father     Outpatient Encounter Medications as of 02/18/2019  Medication Sig  . blood glucose meter kit and supplies Check blood glucose fasting, before lunch, before dinner and at bedtime  . cyclobenzaprine (FLEXERIL) 10 MG tablet Take 1 tablet (10 mg total) by mouth at bedtime.  . insulin degludec (TRESIBA FLEXTOUCH) 100 UNIT/ML SOPN FlexTouch Pen Inject 0.6 mLs (60 Units total) into the skin daily.  . insulin lispro (HUMALOG) 100 UNIT/ML injection Inject 1 Units into the skin 3 (three) times daily before meals.  . Insulin Pen Needle (B-D ULTRAFINE III SHORT PEN) 31G X 8 MM MISC 1 each by Does not apply route as directed.  . metFORMIN (GLUCOPHAGE XR) 500 MG 24 hr tablet Take 1 tablet (500 mg total) by mouth daily with breakfast.  . valACYclovir (VALTREX) 500 MG tablet Take 1 tablet (500 mg total) by mouth daily.  . [DISCONTINUED] insulin degludec (TRESIBA FLEXTOUCH) 100 UNIT/ML SOPN FlexTouch Pen Inject 0.6 mLs (60 Units total) into the skin daily.   No  facility-administered encounter medications on file as of 02/18/2019.    ALLERGIES: Not on File  VACCINATION STATUS:  There is no immunization history on file for this patient.  Diabetes She presents for her follow-up diabetic visit. She has type 2 diabetes mellitus. Onset time: She was diagnosed at approximate age of. Her disease course has been improving. There are no hypoglycemic associated symptoms. Pertinent negatives for hypoglycemia include no confusion, headaches, pallor or seizures. Associated symptoms include fatigue. Pertinent negatives for diabetes include no chest pain, no polydipsia, no polyphagia and no polyuria. There are no hypoglycemic complications. Symptoms are improving. There are no diabetic complications. Risk factors for coronary artery disease include diabetes mellitus and family history. Current diabetic treatments: She is currently on Humalog 15 units 3 times daily AC. Her weight is fluctuating minimally. She is following a generally unhealthy diet. When asked about meal planning, she reported none. She has not had a previous visit with a dietitian. She participates in exercise intermittently. Her home blood glucose trend is increasing steadily. (She reports improved glycemic profile to near target levels.  No hypoglycemia.  Since despite her recent A1c of 12.1%.   ) An ACE inhibitor/angiotensin II receptor blocker is not being taken. Eye exam is not current.   Review of systems: Limited as above.   Objective:    There were no vitals taken for this visit.  Wt Readings from Last 3 Encounters:  02/04/19 168 lb 12.8 oz (76.6 kg)  02/04/19 168 lb 12.8 oz (76.6 kg)  12/13/18 166 lb 3.2 oz (75.4 kg)        CMP ( most recent) CMP     Component Value Date/Time   NA 133 (A) 11/13/2018 0000   K 4.2 11/13/2018 0000   CL 100 11/13/2018 0000   CO2 23 05/11/2009 1551   GLUCOSE 82 05/11/2009 1551   BUN 16 11/13/2018 0000   CREATININE 0.6 11/13/2018 0000   CREATININE  0.77 05/11/2009 1551   CALCIUM 9.6 11/13/2018 0000   PROT 7.2 05/11/2009 1551   ALBUMIN 4.4 11/13/2018 0000   AST 12 (A) 11/13/2018 0000   ALT 15 11/13/2018 0000   ALKPHOS 50 05/11/2009 1551   BILITOT 0.4 05/11/2009 1551   GFRNONAA >60 ml/min 11/12/2018 0000  GFRAA >60 ml/min 11/12/2018 0000     Diabetic Labs (most recent): Lab Results  Component Value Date   HGBA1C 12.1 (A) 12/13/2018     Assessment & Plan:   1. uncontrolled type 2 diabetes - OKEMA ROLLINSON has currently uncontrolled symptomatic type 2 DM since  44 years of age. -Despite her recent A1c of 12.1%, since her last visit, she documented near target glycemic profile both fasting and postprandial.   -No major hypoglycemia. Recent labs reviewed. - I had a long discussion with her about the progressive nature of diabetes and the pathology behind its complications.  -She does not report gross complications, however she remains at a high risk for more acute and chronic complications which include CAD, CVA, CKD, retinopathy, and neuropathy. These are all discussed in detail with her.  - I have counseled her on diet  and weight management  by adopting a carbohydrate restricted/protein rich diet. Patient is encouraged to switch to  unprocessed or minimally processed     complex starch and increased protein intake (animal or plant source), fruits, and vegetables. -  she is advised to stick to a routine mealtimes to eat 3 meals  a day and avoid unnecessary snacks ( to snack only to correct hypoglycemia).   - she  admits there is a room for improvement in her diet and drink choices. -  Suggestion is made for her to avoid simple carbohydrates  from her diet including Cakes, Sweet Desserts / Pastries, Ice Cream, Soda (diet and regular), Sweet Tea, Candies, Chips, Cookies, Sweet Pastries,  Store Bought Juices, Alcohol in Excess of  1-2 drinks a day, Artificial Sweeteners, Coffee Creamer, and "Sugar-free" Products. This will help  patient to have stable blood glucose profile and potentially avoid unintended weight gain.  - she will be scheduled with Jearld Fenton, RDN, CDE for diabetes education.  - I have approached her with the following individualized plan to manage  her diabetes and patient agrees:   -Given her target glycemic response, she will not need prandial insulin for now.   -She is advised to continue Tresiba 60 units nightly, continue to hold Humalog for now, continue to monitor blood glucose 2 times a day-daily before breakfast and at bedtime.    -She has tolerated Metformin 500 mg XR p.o. daily, advised to continue same.   - she is warned not to take insulin without proper monitoring per orders.  - she is encouraged to call clinic for blood glucose levels less than 70 or above 300 mg /dl.    - Specific targets for  A1c;  LDL, HDL,  and Triglycerides were discussed with the patient.  2) Blood Pressure /Hypertension:  she is advised to home monitor blood pressure and report if > 140/90 on 2 separate readings.   He says she never had problem with her blood pressure and wishes to avoid medication at this time.  She will be considered for low-dose ACE inhibitor's if her blood pressure remains above 130/80 on subsequent visits.  3) Lipids/Hyperlipidemia: No recent lipid panel to review.  She is not on statin.  She will be considered for fasting lipid panel before her next visit.    4)  Weight/Diet: Her BMI is 98- -  she is not a candidate for major  weight loss.  I discussed with her the fact that loss of 5 - 10% of her  current body weight will have the most impact on her diabetes management.  Exercise, and detailed  carbohydrates information provided  -  detailed on discharge instructions.  5) Chronic Care/Health Maintenance:  -she  Is not on ACEI/ARB and Statin medications and  is encouraged to initiate and continue to follow up with Ophthalmology, Dentist,  Podiatrist at least yearly or according to  recommendations, and advised to  stay away from smoking. I have recommended yearly flu vaccine and pneumonia vaccine at least every 5 years; moderate intensity exercise for up to 150 minutes weekly; and  sleep for at least 7 hours a day.  - she is  advised to maintain close follow up with [place], Rex Kras, MD for primary care needs, as well as her other providers for optimal and coordinated care.  - Time spent on this patient care encounter:  35 min, of which >50% was spent in  counseling and the rest reviewing her  current and  previous labs/studies ( including abstraction from other facilities),  previous treatments, her blood glucose readings, and medications' doses and developing a plan for long-term care based on the latest recommendations for standards of care; and documenting her care.  Lonna Duval participated in the discussions, expressed understanding, and voiced agreement with the above plans.  All questions were answered to her satisfaction. she is encouraged to contact clinic should she have any questions or concerns prior to her return visit.   Follow up plan: - Return in about 9 weeks (around 04/22/2019) for Bring Meter and Logs- A1c in Office.  Glade Lloyd, MD [facility] [address] [place], [place] [address] Phone: [phone]  Fax: [phone]    02/18/2019, 7:27 PM  This note was partially dictated with voice recognition software. Similar sounding words can be transcribed inadequately or may not  be corrected upon review.

## 2019-02-21 ENCOUNTER — Other Ambulatory Visit: Payer: Self-pay

## 2019-02-21 MED ORDER — BD PEN NEEDLE SHORT U/F 31G X 8 MM MISC: 1.0000 | Freq: Four times a day (QID) | 3 refills | Status: AC

## 2019-03-19 ENCOUNTER — Telehealth: Payer: Self-pay | Admitting: Nutrition

## 2019-03-19 NOTE — Telephone Encounter (Signed)
No VM available. Canceled appt due to inclement weather.

## 2019-03-21 ENCOUNTER — Ambulatory Visit: Payer: BC Managed Care – PPO | Admitting: Nutrition

## 2019-04-24 ENCOUNTER — Ambulatory Visit: Payer: BC Managed Care – PPO | Admitting: "Endocrinology

## 2019-05-03 ENCOUNTER — Ambulatory Visit: Payer: Self-pay | Attending: Internal Medicine

## 2019-05-03 DIAGNOSIS — Z23 Encounter for immunization: Secondary | ICD-10-CM

## 2019-05-03 NOTE — Progress Notes (Signed)
   Covid-19 Vaccination Clinic  Name:  Brandi Oneill    MRN: [id number] DOB: 11/04/1975  05/03/2019  Brandi Oneill was observed post Covid-19 immunization for 15 minutes without incident. She was provided with Vaccine Information Sheet and instruction to access the V-Safe system.   Brandi Oneill was instructed to call 911 with any severe reactions post vaccine: Marland Kitchen Difficulty breathing  . Swelling of face and throat  . A fast heartbeat  . A bad rash all over body  . Dizziness and weakness   Immunizations Administered    Name Date Dose VIS Date Route   Pfizer COVID-19 Vaccine 05/03/2019  9:16 AM 0.3 mL 01/11/2019 Intramuscular   Manufacturer: [employer], [employer]   Lot: [id number]   NDC: [address]-3

## 2019-05-10 ENCOUNTER — Ambulatory Visit: Payer: Self-pay | Admitting: "Endocrinology

## 2019-05-27 ENCOUNTER — Other Ambulatory Visit: Payer: Self-pay | Admitting: "Endocrinology

## 2019-05-28 ENCOUNTER — Ambulatory Visit: Payer: Self-pay | Attending: Internal Medicine

## 2019-05-28 DIAGNOSIS — Z23 Encounter for immunization: Secondary | ICD-10-CM

## 2019-05-28 NOTE — Progress Notes (Signed)
   Covid-19 Vaccination Clinic  Name:  Brandi Oneill    MRN: [id number] DOB: 01/27/1976  05/28/2019  Brandi Oneill was observed post Covid-19 immunization for 15 minutes without incident. She was provided with Vaccine Information Sheet and instruction to access the V-Safe system.   Brandi Oneill was instructed to call 911 with any severe reactions post vaccine: Marland Kitchen Difficulty breathing  . Swelling of face and throat  . A fast heartbeat  . A bad rash all over body  . Dizziness and weakness   Immunizations Administered    Name Date Dose VIS Date Route   Pfizer COVID-19 Vaccine 05/28/2019  9:39 AM 0.3 mL 03/27/2018 Intramuscular   Manufacturer: [employer], [employer]   Lot: [id number]   NDC: [address]-1

## 2019-06-10 ENCOUNTER — Encounter: Payer: Self-pay | Admitting: "Endocrinology

## 2019-06-10 ENCOUNTER — Ambulatory Visit (INDEPENDENT_AMBULATORY_CARE_PROVIDER_SITE_OTHER): Payer: 59 | Admitting: "Endocrinology

## 2019-06-10 ENCOUNTER — Other Ambulatory Visit: Payer: Self-pay

## 2019-06-10 VITALS — BP 108/75 | HR 93 | Ht 66.0 in | Wt 177.4 lb

## 2019-06-10 DIAGNOSIS — Z794 Long term (current) use of insulin: Secondary | ICD-10-CM | POA: Diagnosis not present

## 2019-06-10 DIAGNOSIS — E118 Type 2 diabetes mellitus with unspecified complications: Secondary | ICD-10-CM

## 2019-06-10 DIAGNOSIS — I10 Essential (primary) hypertension: Secondary | ICD-10-CM | POA: Diagnosis not present

## 2019-06-10 LAB — POCT GLYCOSYLATED HEMOGLOBIN (HGB A1C): Hemoglobin A1C: 10.2 % — AB (ref 4.0–5.6)

## 2019-06-10 NOTE — Patient Instructions (Signed)

## 2019-06-10 NOTE — Progress Notes (Signed)
06/10/2019, 11:05 AM         Endocrinology follow-up note   Subjective:    Patient ID: Brandi Oneill, female    DOB: Mar 25, 1975.  Brandi Oneill is being seen in follow-up for management of currently uncontrolled symptomatic diabetes requested by  Maryruth Hancock, MD.   Past Medical History:  Diagnosis Date  . Allergy   . Diabetes mellitus without complication ([place])     History reviewed. No pertinent surgical history.  Social History   Socioeconomic History  . Marital status: Single    Spouse name: Not on file  . Number of children: Not on file  . Years of education: Not on file  . Highest education level: Not on file  Occupational History  . Occupation: shipping & receiving  Tobacco Use  . Smoking status: Never Smoker  . Smokeless tobacco: Never Used  Substance and Sexual Activity  . Alcohol use: Not Currently  . Drug use: Not Currently  . Sexual activity: Yes  Other Topics Concern  . Not on file  Social History Narrative  . Not on file   Social Determinants of Health   Financial Resource Strain:   . Difficulty of Paying Living Expenses:   Food Insecurity:   . Worried About Charity fundraiser in the Last Year:   . Arboriculturist in the Last Year:   Transportation Needs:   . Film/video editor (Medical):   Marland Kitchen Lack of Transportation (Non-Medical):   Physical Activity:   . Days of Exercise per Week:   . Minutes of Exercise per Session:   Stress:   . Feeling of Stress :   Social Connections:   . Frequency of Communication with Friends and Family:   . Frequency of Social Gatherings with Friends and Family:   . Attends Religious Services:   . Active Member of Clubs or Organizations:   . Attends Archivist Meetings:   Marland Kitchen Marital Status:     Family History  Problem Relation Age of Onset  . Diabetes Mother   . Diabetes Father   . Hypertension Father      Outpatient Encounter Medications as of 06/10/2019  Medication Sig  . blood glucose meter kit and supplies Check blood glucose fasting, before lunch, before dinner and at bedtime  . cyclobenzaprine (FLEXERIL) 10 MG tablet Take 1 tablet (10 mg total) by mouth at bedtime.  . Insulin Pen Needle (B-D ULTRAFINE III SHORT PEN) 31G X 8 MM MISC 1 each by Does not apply route 4 (four) times daily.  . metFORMIN (GLUCOPHAGE XR) 500 MG 24 hr tablet Take 1 tablet (500 mg total) by mouth daily with breakfast.  . TRESIBA FLEXTOUCH 100 UNIT/ML FlexTouch Pen INJECT 60 UNITS SUBCUTANEOUSLY ONCE DAILY  . valACYclovir (VALTREX) 500 MG tablet Take 1 tablet (500 mg total) by mouth daily.  . [DISCONTINUED] insulin lispro (HUMALOG) 100 UNIT/ML injection Inject 1 Units into the skin 3 (three) times daily before meals.   No facility-administered encounter medications on file as of 06/10/2019.    ALLERGIES: No Known Allergies  VACCINATION STATUS: Immunization History  Administered Date(s) Administered  . PFIZER SARS-COV-2 Vaccination 05/03/2019, 05/28/2019    Diabetes She presents for her follow-up diabetic visit. She has type 2 diabetes mellitus. Onset time: She was diagnosed at approximate age of. Her disease course has been improving. There are no hypoglycemic associated symptoms. Pertinent negatives for hypoglycemia include no confusion, headaches, pallor or seizures. Associated symptoms include fatigue, polydipsia and polyuria. Pertinent negatives for diabetes include no chest pain and no polyphagia. There are no hypoglycemic complications. Symptoms are improving. There are no diabetic complications. Risk factors for coronary artery disease include diabetes mellitus and family history. Current diabetic treatments: She is currently on Humalog 15 units 3 times daily AC. Her weight is fluctuating minimally. She is following a generally unhealthy diet. When asked about meal planning, she reported none. She has not  had a previous visit with a dietitian. She participates in exercise intermittently. Her home blood glucose trend is decreasing steadily. (She did not bring any logs nor meter with her today.  Her point-of-care A1c is 10. 2 % , improving from 12.1%.    ) An ACE inhibitor/angiotensin II receptor blocker is not being taken. Eye exam is not current.    Review of systems  Constitutional: + Minimally fluctuating body weight,  current  Body mass index is 28.63 kg/m. , no fatigue, no subjective hyperthermia, no subjective hypothermia Eyes: no blurry vision, no xerophthalmia ENT: no sore throat, no nodules palpated in throat, no dysphagia/odynophagia, no hoarseness Cardiovascular: no Chest Pain, no Shortness of Breath, no palpitations, no leg swelling Respiratory: no cough, no shortness of breath Gastrointestinal: no Nausea/Vomiting/Diarhhea Musculoskeletal: no muscle/joint aches Skin: no rashes, no hyperemia Neurological: no tremors, no numbness, no tingling, no dizziness Psychiatric: no depression, no anxiety    Objective:    BP 108/75   Pulse 93   Ht 5' 6"  (1.676 m)   Wt 177 lb 6.4 oz (80.5 kg)   BMI 28.63 kg/m   Wt Readings from Last 3 Encounters:  06/10/19 177 lb 6.4 oz (80.5 kg)  02/04/19 168 lb 12.8 oz (76.6 kg)  02/04/19 168 lb 12.8 oz (76.6 kg)       Physical Exam- Limited  Constitutional:  Body mass index is 28.63 kg/m. , not in acute distress, normal state of mind Eyes:  EOMI, no exophthalmos Neck: Supple Thyroid: No gross goiter Respiratory: Adequate breathing efforts Musculoskeletal: no gross deformities, strength intact in all four extremities, no gross restriction of joint movements Skin:  no rashes, no hyperemia Neurological: no tremor with outstretched hands,    CMP ( most recent) CMP     Component Value Date/Time   NA 133 (A) 11/13/2018 0000   K 4.2 11/13/2018 0000   CL 100 11/13/2018 0000   CO2 23 05/11/2009 1551   GLUCOSE 82 05/11/2009 1551   BUN  16 11/13/2018 0000   CREATININE 0.6 11/13/2018 0000   CREATININE 0.77 05/11/2009 1551   CALCIUM 9.6 11/13/2018 0000   PROT 7.2 05/11/2009 1551   ALBUMIN 4.4 11/13/2018 0000   AST 12 (A) 11/13/2018 0000   ALT 15 11/13/2018 0000   ALKPHOS 50 05/11/2009 1551   BILITOT 0.4 05/11/2009 1551   GFRNONAA >60 ml/min 11/12/2018 0000   GFRAA >60 ml/min 11/12/2018 0000     Diabetic Labs (most recent): Lab Results  Component Value Date   HGBA1C 10.2 (A) 06/10/2019   HGBA1C 12.1 (A) 12/13/2018      Assessment & Plan:   1. uncontrolled type 2 diabetes - Beverly Gust  [place] has currently uncontrolled symptomatic type 2 DM since  44 years of age.  She did not bring any logs nor meter with her today.  Her point-of-care A1c is 10. 2 % , improving from 12.1%.  She describes significant interruption in her insulin injections due to lack of coverage on product.  She has changed jobs since last visit, and now has a new measurement. -She denies hypoglycemia.  - Recent labs reviewed. - I had a long discussion with her about the progressive nature of diabetes and the pathology behind its complications.  -She does not report gross complications, however she remains at a high risk for more acute and chronic complications which include CAD, CVA, CKD, retinopathy, and neuropathy. These are all discussed in detail with her.  - I have counseled her on diet  and weight management  by adopting a carbohydrate restricted/protein rich diet. Patient is encouraged to switch to  unprocessed or minimally processed     complex starch and increased protein intake (animal or plant source), fruits, and vegetables. -  she is advised to stick to a routine mealtimes to eat 3 meals  a day and avoid unnecessary snacks ( to snack only to correct hypoglycemia).    - she  admits there is a room for improvement in her diet and drink choices. -  Suggestion is made for her to avoid simple carbohydrates  from her diet including Cakes,  Sweet Desserts / Pastries, Ice Cream, Soda (diet and regular), Sweet Tea, Candies, Chips, Cookies, Sweet Pastries,  Store Bought Juices, Alcohol in Excess of  1-2 drinks a day, Artificial Sweeteners, Coffee Creamer, and "Sugar-free" Products. This will help patient to have stable blood glucose profile and potentially avoid unintended weight gain.   - she has been  scheduled with Jearld Fenton, RDN, CDE for diabetes education.  - I have approached her with the following individualized plan to manage  her diabetes and patient agrees:   -Given her presentation with still above target A1c of 10.4% , she may need multiple daily injections of insulin in order for her to achieve control of diabetes to target.    -In preparation, she is approached to resume [place] 60 units nightly, start monitoring blood glucose 4 times a day-before meals and at bedtime and return in 2 weeks for reevaluation.    -She has tolerated Metformin 500 mg XR p.o. daily- advised to continue same.   - she is warned not to take insulin without proper monitoring per orders.  - she is encouraged to call clinic for blood glucose levels less than 70 or above 300 mg /dl.    - Specific targets for  A1c;  LDL, HDL,  and Triglycerides were discussed with the patient.  2) Blood Pressure /Hypertension:   Her blood pressure is controlled to target.   He says she never had problem with her blood pressure and wishes to avoid medication at this time.  She will be considered for low-dose ACE inhibitor's if her blood pressure remains above 130/80 on subsequent visits.  3) Lipids/Hyperlipidemia: She does not have recent lipid panel.  She is not on statin.  She will be considered for fasting lipid panel before her next visit.    4)  Weight/Diet: Her BMI is 28.6 - -  she is a candidate for some weight loss.  I discussed with her the fact that loss of 5 - 10% of her  current body weight will have the most impact on her diabetes  management.   Exercise, and detailed carbohydrates information provided  -  detailed on discharge instructions.  5) Chronic Care/Health Maintenance:  -she  Is not on ACEI/ARB and Statin medications and  is encouraged to initiate and continue to follow up with Ophthalmology, Dentist,  Podiatrist at least yearly or according to recommendations, and advised to  stay away from smoking. I have recommended yearly flu vaccine and pneumonia vaccine at least every 5 years; moderate intensity exercise for up to 150 minutes weekly; and  sleep for at least 7 hours a day.  - she is  advised to maintain close follow up with [place], Rex Kras, MD for primary care needs, as well as her other providers for optimal and coordinated care.  - Time spent on this patient care encounter:  35 min, of which > 50% was spent in  counseling and the rest reviewing her blood glucose logs , discussing her hypoglycemia and hyperglycemia episodes, reviewing her current and  previous labs / studies  ( including abstraction from other facilities) and medications  doses and developing a  long term treatment plan and documenting her care.   Please refer to Patient Instructions for Blood Glucose Monitoring and Insulin/Medications Dosing Guide"  in media tab for additional information. Please  also refer to " Patient Self Inventory" in the Media  tab for reviewed elements of pertinent patient history.  Lonna Duval participated in the discussions, expressed understanding, and voiced agreement with the above plans.  All questions were answered to her satisfaction. she is encouraged to contact clinic should she have any questions or concerns prior to her return visit.   Follow up plan: - Return in about 2 weeks (around 06/24/2019) for Follow up with Meter and Logs Only - no Labs.  Glade Lloyd, MD [facility] [address] [place], [place] [address] Phone: [phone]  Fax: [phone]     06/10/2019, 11:05 AM  This note was partially dictated with voice recognition software. Similar sounding words can be transcribed inadequately or may not  be corrected upon review.

## 2019-06-27 ENCOUNTER — Ambulatory Visit: Payer: 59 | Admitting: "Endocrinology

## 2019-07-19 ENCOUNTER — Encounter: Payer: Self-pay | Admitting: "Endocrinology

## 2019-07-19 ENCOUNTER — Ambulatory Visit (INDEPENDENT_AMBULATORY_CARE_PROVIDER_SITE_OTHER): Payer: 59 | Admitting: "Endocrinology

## 2019-07-19 ENCOUNTER — Other Ambulatory Visit: Payer: Self-pay

## 2019-07-19 VITALS — BP 132/89 | HR 91 | Ht 66.0 in | Wt 177.2 lb

## 2019-07-19 DIAGNOSIS — R252 Cramp and spasm: Secondary | ICD-10-CM

## 2019-07-19 DIAGNOSIS — E118 Type 2 diabetes mellitus with unspecified complications: Secondary | ICD-10-CM | POA: Diagnosis not present

## 2019-07-19 DIAGNOSIS — I10 Essential (primary) hypertension: Secondary | ICD-10-CM

## 2019-07-19 DIAGNOSIS — Z794 Long term (current) use of insulin: Secondary | ICD-10-CM | POA: Diagnosis not present

## 2019-07-19 MED ORDER — GLIPIZIDE ER 5 MG PO TB24: 5.0000 mg | ORAL_TABLET | Freq: Every day | ORAL | 3 refills | Status: DC

## 2019-07-19 NOTE — Patient Instructions (Signed)

## 2019-07-19 NOTE — Progress Notes (Signed)
07/19/2019, 2:02 PM         Endocrinology follow-up note   Subjective:    Patient ID: Brandi Oneill, female    DOB: Jun 12, 1975.  Brandi Oneill is being seen in follow-up for management of currently uncontrolled symptomatic diabetes requested by  Patient, No Pcp Per.   Past Medical History:  Diagnosis Date  . Allergy   . Diabetes mellitus without complication ([place])     History reviewed. No pertinent surgical history.  Social History   Socioeconomic History  . Marital status: Single    Spouse name: Not on file  . Number of children: Not on file  . Years of education: Not on file  . Highest education level: Not on file  Occupational History  . Occupation: shipping & receiving  Tobacco Use  . Smoking status: Never Smoker  . Smokeless tobacco: Never Used  Substance and Sexual Activity  . Alcohol use: Not Currently  . Drug use: Not Currently  . Sexual activity: Yes  Other Topics Concern  . Not on file  Social History Narrative  . Not on file   Social Determinants of Health   Financial Resource Strain:   . Difficulty of Paying Living Expenses:   Food Insecurity:   . Worried About Charity fundraiser in the Last Year:   . Arboriculturist in the Last Year:   Transportation Needs:   . Film/video editor (Medical):   Marland Kitchen Lack of Transportation (Non-Medical):   Physical Activity:   . Days of Exercise per Week:   . Minutes of Exercise per Session:   Stress:   . Feeling of Stress :   Social Connections:   . Frequency of Communication with Friends and Family:   . Frequency of Social Gatherings with Friends and Family:   . Attends Religious Services:   . Active Member of Clubs or Organizations:   . Attends Archivist Meetings:   Marland Kitchen Marital Status:     Family History  Problem Relation Age of Onset  . Diabetes Mother   . Diabetes Father   . Hypertension Father      Outpatient Encounter Medications as of 07/19/2019  Medication Sig  . blood glucose meter kit and supplies Check blood glucose fasting, before lunch, before dinner and at bedtime  . cyclobenzaprine (FLEXERIL) 10 MG tablet Take 1 tablet (10 mg total) by mouth at bedtime.  . Insulin Pen Needle (B-D ULTRAFINE III SHORT PEN) 31G X 8 MM MISC 1 each by Does not apply route 4 (four) times daily.  . metFORMIN (GLUCOPHAGE XR) 500 MG 24 hr tablet Take 1 tablet (500 mg total) by mouth daily with breakfast.  . TRESIBA FLEXTOUCH 100 UNIT/ML FlexTouch Pen INJECT 60 UNITS SUBCUTANEOUSLY ONCE DAILY  . valACYclovir (VALTREX) 500 MG tablet Take 1 tablet (500 mg total) by mouth daily.  Marland Kitchen glipiZIDE (GLUCOTROL XL) 5 MG 24 hr tablet Take 1 tablet (5 mg total) by mouth daily with breakfast.   No facility-administered encounter medications on file as of 07/19/2019.    ALLERGIES: No Known Allergies  VACCINATION STATUS: Immunization History  Administered Date(s) Administered  . PFIZER SARS-COV-2 Vaccination 05/03/2019, 05/28/2019    Diabetes She presents for her follow-up diabetic visit. She has type 2 diabetes mellitus. Onset time: She was diagnosed at approximate age of. Her disease course has been worsening. There are no hypoglycemic associated symptoms. Pertinent negatives for hypoglycemia include no confusion, headaches, pallor or seizures. Associated symptoms include fatigue, polydipsia and polyuria. Pertinent negatives for diabetes include no chest pain and no polyphagia. There are no hypoglycemic complications. Symptoms are worsening. There are no diabetic complications. Risk factors for coronary artery disease include diabetes mellitus and family history. Current diabetic treatment includes oral agent (dual therapy) and insulin injections. Her weight is increasing steadily. She is following a generally unhealthy diet. When asked about meal planning, she reported none. She has not had a previous visit with a  dietitian. She participates in exercise intermittently. Her home blood glucose trend is increasing steadily. Her breakfast blood glucose range is generally 180-200 mg/dl. Her bedtime blood glucose range is generally 180-200 mg/dl. Her overall blood glucose range is 180-200 mg/dl. (She presents with consistent, still above target glycemic profile averaging between 190-206 in the last 30 days.  Her point-of-care A1c is 10.2% recently.  She did not document or report hypoglycemia.  ) An ACE inhibitor/angiotensin II receptor blocker is not being taken. Eye exam is not current.    Review of systems  Constitutional: + Minimally fluctuating body weight,  current  Body mass index is 28.6 kg/m. , no fatigue, no subjective hyperthermia, no subjective hypothermia Eyes: no blurry vision, no xerophthalmia ENT: no sore throat, no nodules palpated in throat, no dysphagia/odynophagia, no hoarseness Cardiovascular: no Chest Pain, no Shortness of Breath, no palpitations, no leg swelling Respiratory: no cough, no shortness of breath Gastrointestinal: no Nausea/Vomiting/Diarhhea Musculoskeletal: no muscle/joint aches, she complains of crampy pain on her left calf.   Skin: no rashes, no hyperemia Neurological: no tremors, no numbness, no tingling, no dizziness Psychiatric: no depression, no anxiety    Objective:    BP 132/89 (BP Location: Right Arm, Patient Position: Sitting)   Pulse 91   Ht 5' 6"  (1.676 m)   Wt 177 lb 3.2 oz (80.4 kg)   BMI 28.60 kg/m   Wt Readings from Last 3 Encounters:  07/19/19 177 lb 3.2 oz (80.4 kg)  06/10/19 177 lb 6.4 oz (80.5 kg)  02/04/19 168 lb 12.8 oz (76.6 kg)       Physical Exam- Limited  Constitutional:  Body mass index is 28.6 kg/m. , not in acute distress, normal state of mind Eyes:  EOMI, no exophthalmos Neck: Supple Thyroid: No gross goiter Respiratory: Adequate breathing efforts Musculoskeletal: no gross deformities, strength intact in all four  extremities, + there is asymmetry of her calf, left larger than right .  Skin:  no rashes, no hyperemia, + foot exam is normal. Neurological: no tremor with outstretched hands,    CMP ( most recent) CMP     Component Value Date/Time   NA 133 (A) 11/13/2018 0000   K 4.2 11/13/2018 0000   CL 100 11/13/2018 0000   CO2 23 05/11/2009 1551   GLUCOSE 82 05/11/2009 1551   BUN 16 11/13/2018 0000   CREATININE 0.6 11/13/2018 0000   CREATININE 0.77 05/11/2009 1551   CALCIUM 9.6 11/13/2018 0000   PROT 7.2 05/11/2009 1551   ALBUMIN 4.4 11/13/2018 0000   AST 12 (A) 11/13/2018 0000   ALT 15 11/13/2018 0000   ALKPHOS 50 05/11/2009 1551   BILITOT 0.4  05/11/2009 1551   GFRNONAA >60 ml/min 11/12/2018 0000   GFRAA >60 ml/min 11/12/2018 0000     Diabetic Labs (most recent): Lab Results  Component Value Date   HGBA1C 10.2 (A) 06/10/2019   HGBA1C 12.1 (A) 12/13/2018     Assessment & Plan:   1. uncontrolled type 2 diabetes - MELIYAH SIMON has currently uncontrolled symptomatic type 2 DM since  44 years of age.  She presents with consistent, still above target glycemic profile averaging between 190-206 in the last 30 days.  Her point-of-care A1c is 10.2% recently.  She did not document or report hypoglycemia.  - Recent labs reviewed. - I had a long discussion with her about the progressive nature of diabetes and the pathology behind its complications.  -She does not report gross complications, however she remains at a high risk for more acute and chronic complications which include CAD, CVA, CKD, retinopathy, and neuropathy. These are all discussed in detail with her.  - I have counseled her on diet  and weight management  by adopting a carbohydrate restricted/protein rich diet. Patient is encouraged to switch to  unprocessed or minimally processed     complex starch and increased protein intake (animal or plant source), fruits, and vegetables. -  she is advised to stick to a routine  mealtimes to eat 3 meals  a day and avoid unnecessary snacks ( to snack only to correct hypoglycemia).    - she  admits there is a room for improvement in her diet and drink choices. -  Suggestion is made for her to avoid simple carbohydrates  from her diet including Cakes, Sweet Desserts / Pastries, Ice Cream, Soda (diet and regular), Sweet Tea, Candies, Chips, Cookies, Sweet Pastries,  Store Bought Juices, Alcohol in Excess of  1-2 drinks a day, Artificial Sweeteners, Coffee Creamer, and "Sugar-free" Products. This will help patient to have stable blood glucose profile and potentially avoid unintended weight gain.   - she has been  scheduled with Jearld Fenton, RDN, CDE for diabetes education.  - I have approached her with the following individualized plan to manage  her diabetes and patient agrees:   -Given her presentation with still above target glycemic range and recent A1c of 10.4%, she will continue to need intervention with insulin.  I discussed and increase her [place] to 70 units nightly,   advised to continue monitoring blood glucose 2 times a day-before breakfast and at bedtime.    -She has tolerated Metformin 500 mg XR p.o. daily- advised to continue same.   - she is warned not to take insulin without proper monitoring per orders.  - she is encouraged to call clinic for blood glucose levels less than 70 or above 300 mg /dl. -She would benefit from addition of glipizide.  I discussed and added glipizide 5 mg XL p.o. daily at breakfast.    - Specific targets for  A1c;  LDL, HDL,  and Triglycerides were discussed with the patient.  2) Blood Pressure /Hypertension:   Her blood pressure is controlled to target.   He says she never had problem with her blood pressure and wishes to avoid medication at this time.  She will be considered for low-dose ACE inhibitor's if her blood pressure remains above 130/80 on subsequent visits.  3) Lipids/Hyperlipidemia: She does not have recent  lipid panel.  She is not on statin.  She will be considered for fasting lipid panel before her next visit.    4)  Weight/Diet:  Her BMI is 28.6 - -  she is a candidate for some weight loss.  I discussed with her the fact that loss of 5 - 10% of her  current body weight will have the most impact on her diabetes management.  Exercise, and detailed carbohydrates information provided  -  detailed on discharge instructions.  5) Chronic Care/Health Maintenance:  -she  Is not on ACEI/ARB and Statin medications and  is encouraged to initiate and continue to follow up with Ophthalmology, Dentist,  Podiatrist at least yearly or according to recommendations, and advised to  stay away from smoking. I have recommended yearly flu vaccine and pneumonia vaccine at least every 5 years; moderate intensity exercise for up to 150 minutes weekly; and  sleep for at least 7 hours a day. -She is offered Doppler arterial ultrasound of left leg given her complaint of cramping pain on the left lower extremity.  - she is  advised to maintain close follow up with Patient, No Pcp Per for primary care needs, as well as her other providers for optimal and coordinated care.  - Time spent on this patient care encounter:  35 min, of which > 50% was spent in  counseling and the rest reviewing her blood glucose logs , discussing her hypoglycemia and hyperglycemia episodes, reviewing her current and  previous labs / studies  ( including abstraction from other facilities) and medications  doses and developing a  long term treatment plan and documenting her care.   Please refer to Patient Instructions for Blood Glucose Monitoring and Insulin/Medications Dosing Guide"  in media tab for additional information. Please  also refer to " Patient Self Inventory" in the Media  tab for reviewed elements of pertinent patient history.  Lonna Duval participated in the discussions, expressed understanding, and voiced agreement with the above plans.   All questions were answered to her satisfaction. she is encouraged to contact clinic should she have any questions or concerns prior to her return visit.   Follow up plan: - Return in about 3 months (around 10/19/2019), or Ultrasound anytime, for F/U with Pre-visit Labs, Meter, Logs, A1c here.Glade Lloyd, MD [facility] [address] [place], [place] [address] Phone: [phone]  Fax: [phone]    07/19/2019, 2:02 PM  This note was partially dictated with voice recognition software. Similar sounding words can be transcribed inadequately or may not  be corrected upon review.

## 2019-10-24 ENCOUNTER — Ambulatory Visit: Payer: 59 | Admitting: Nurse Practitioner

## 2019-11-14 ENCOUNTER — Ambulatory Visit (INDEPENDENT_AMBULATORY_CARE_PROVIDER_SITE_OTHER): Payer: 59 | Admitting: Nurse Practitioner

## 2019-11-14 ENCOUNTER — Other Ambulatory Visit: Payer: Self-pay

## 2019-11-14 ENCOUNTER — Encounter: Payer: Self-pay | Admitting: Nurse Practitioner

## 2019-11-14 VITALS — BP 126/84 | HR 83 | Ht 66.0 in | Wt 179.8 lb

## 2019-11-14 DIAGNOSIS — I10 Essential (primary) hypertension: Secondary | ICD-10-CM | POA: Diagnosis not present

## 2019-11-14 DIAGNOSIS — Z794 Long term (current) use of insulin: Secondary | ICD-10-CM

## 2019-11-14 DIAGNOSIS — E118 Type 2 diabetes mellitus with unspecified complications: Secondary | ICD-10-CM

## 2019-11-14 LAB — COMPLETE METABOLIC PANEL WITHOUT GFR
AG Ratio: 1.5 (calc) (ref 1.0–2.5)
ALT: 11 U/L (ref 6–29)
AST: 7 U/L — ABNORMAL LOW (ref 10–35)
Albumin: 4.3 g/dL (ref 3.6–5.1)
Alkaline phosphatase (APISO): 64 U/L (ref 31–125)
BUN: 13 mg/dL (ref 7–25)
CO2: 25 mmol/L (ref 20–32)
Calcium: 9.3 mg/dL (ref 8.6–10.2)
Chloride: 106 mmol/L (ref 98–110)
Creat: 0.83 mg/dL (ref 0.50–1.10)
GFR, Est African American: 99 mL/min/1.73m2
GFR, Est Non African American: 85 mL/min/1.73m2
Globulin: 2.8 g/dL (ref 1.9–3.7)
Glucose, Bld: 89 mg/dL (ref 65–99)
Potassium: 4.4 mmol/L (ref 3.5–5.3)
Sodium: 138 mmol/L (ref 135–146)
Total Bilirubin: 0.4 mg/dL (ref 0.2–1.2)
Total Protein: 7.1 g/dL (ref 6.1–8.1)

## 2019-11-14 LAB — POCT GLYCOSYLATED HEMOGLOBIN (HGB A1C): Hemoglobin A1C: 7.2 % — AB (ref 4.0–5.6)

## 2019-11-14 LAB — LIPID PANEL
Cholesterol: 129 mg/dL
HDL: 37 mg/dL — ABNORMAL LOW
LDL Cholesterol (Calc): 77 mg/dL
Non-HDL Cholesterol (Calc): 92 mg/dL
Total CHOL/HDL Ratio: 3.5 (calc)
Triglycerides: 66 mg/dL

## 2019-11-14 LAB — T4, FREE: Free T4: 0.9 ng/dL (ref 0.8–1.8)

## 2019-11-14 LAB — TSH: TSH: 2.18 m[IU]/L

## 2019-11-14 MED ORDER — TRESIBA FLEXTOUCH 100 UNIT/ML ~~LOC~~ SOPN: 60.0000 [IU] | PEN_INJECTOR | Freq: Every day | SUBCUTANEOUS | 3 refills | Status: DC

## 2019-11-14 NOTE — Progress Notes (Signed)
11/14/2019, 1:11 PM         Endocrinology follow-up note   Subjective:    Patient ID: ARTHA STAVROS, female    DOB: [birth year].  YALISSA FINK is being seen in follow-up for management of currently uncontrolled symptomatic diabetes requested by  Patient, No Pcp Per.   Past Medical History:  Diagnosis Date   Allergy    Diabetes mellitus without complication ([place])     History reviewed. No pertinent surgical history.  Social History   Socioeconomic History   Marital status: Single    Spouse name: Not on file   Number of children: Not on file   Years of education: Not on file   Highest education level: Not on file  Occupational History   Occupation: shipping & receiving  Tobacco Use   Smoking status: Never Smoker   Smokeless tobacco: Never Used  Substance and Sexual Activity   Alcohol use: Not Currently   Drug use: Not Currently   Sexual activity: Yes  Other Topics Concern   Not on file  Social History Narrative   Not on file   Social Determinants of Health   Financial Resource Strain:    Difficulty of Paying Living Expenses: Not on file  Food Insecurity:    Worried About [place] in the Last Year: Not on file   Ran Out of Food in the Last Year: Not on file  Transportation Needs:    Lack of Transportation (Medical): Not on file   Lack of Transportation (Non-Medical): Not on file  Physical Activity:    Days of Exercise per Week: Not on file   Minutes of Exercise per Session: Not on file  Stress:    Feeling of Stress : Not on file  Social Connections:    Frequency of Communication with Friends and Family: Not on file   Frequency of Social Gatherings with Friends and Family: Not on file   Attends Religious Services: Not on file   Active Member of [place] or Organizations: Not on file   Attends Archivist Meetings: Not on file    Marital Status: Not on file    Family History  Problem Relation Age of Onset   Diabetes Mother    Diabetes Father    Hypertension Father     Outpatient Encounter Medications as of 11/14/2019  Medication Sig   blood glucose meter kit and supplies Check blood glucose fasting, before lunch, before dinner and at bedtime   cyclobenzaprine (FLEXERIL) 10 MG tablet Take 1 tablet (10 mg total) by mouth at bedtime.   glipiZIDE (GLUCOTROL XL) 5 MG 24 hr tablet Take 1 tablet (5 mg total) by mouth daily with breakfast.   insulin degludec (TRESIBA FLEXTOUCH) 100 UNIT/ML FlexTouch Pen Inject 60 Units into the skin at bedtime.   Insulin Pen Needle (B-D ULTRAFINE III SHORT PEN) 31G X 8 MM MISC 1 each by Does not apply route 4 (four) times daily.   metFORMIN (GLUCOPHAGE XR) 500 MG 24 hr tablet Take 1 tablet (500 mg total) by mouth daily with breakfast.   valACYclovir (VALTREX) 500 MG tablet  Take 1 tablet (500 mg total) by mouth daily.   [DISCONTINUED] TRESIBA FLEXTOUCH 100 UNIT/ML FlexTouch Pen INJECT 60 UNITS SUBCUTANEOUSLY ONCE DAILY   No facility-administered encounter medications on file as of 11/14/2019.    ALLERGIES: No Known Allergies  VACCINATION STATUS: Immunization History  Administered Date(s) Administered   PFIZER SARS-COV-2 Vaccination 05/03/2019, 05/28/2019    Diabetes She presents for her follow-up diabetic visit. She has type 2 diabetes mellitus. Onset time: She was diagnosed at approximate age of. Her disease course has been improving. Hypoglycemia symptoms include nervousness/anxiousness, sweats and tremors. Pertinent negatives for hypoglycemia include no confusion, headaches, pallor or seizures. Associated symptoms include foot paresthesias. Pertinent negatives for diabetes include no chest pain, no fatigue, no polydipsia, no polyphagia and no polyuria. There are no hypoglycemic complications. Symptoms are improving. There are no diabetic complications. Risk factors  for coronary artery disease include diabetes mellitus and family history. Current diabetic treatment includes oral agent (dual therapy) and insulin injections. She is compliant with treatment most of the time. Her weight is increasing steadily. She is following a generally healthy diet. When asked about meal planning, she reported none. She has not had a previous visit with a dietitian. She participates in exercise intermittently. Her home blood glucose trend is decreasing steadily. Her breakfast blood glucose range is generally 90-110 mg/dl. Her bedtime blood glucose range is generally 130-140 mg/dl. Her overall blood glucose range is 140-180 mg/dl. (She presents today with her meter, no logs, showing greatly improved glycemic profile overall.  Review of her meter shows 7-day average of 150, 14-day average of 148 and 30-day average of 148.  Her POCT A1C today is 7.2%, improving drastically from last visit of 10.2%.  She reports she has changed her diet and exercise which has contributed to this improvement.  She does have some frequent tight fasting glucose readings ranging 60-70s.  ) An ACE inhibitor/angiotensin II receptor blocker is not being taken. She does not see a podiatrist.Eye exam is not current.    Review of systems  Constitutional: + Minimally fluctuating body weight,  current Body mass index is 29.02 kg/m. , no fatigue, no subjective hyperthermia, no subjective hypothermia Eyes: no blurry vision, no xerophthalmia ENT: no sore throat, no nodules palpated in throat, no dysphagia/odynophagia, no hoarseness Cardiovascular: no chest pain, no shortness of breath, no palpitations, occasional leg swelling Respiratory: no cough, no shortness of breath Gastrointestinal: no nausea/vomiting/diarrhea Musculoskeletal: no muscle/joint aches Skin: no rashes, no hyperemia Neurological: no tremors, no dizziness, + numbness/tingling in bilateral feet Psychiatric: no depression, no anxiety   Objective:     BP 126/84 (BP Location: Left Arm, Patient Position: Sitting)    Pulse 83    Ht _0  (1.676 m)    Wt 179 lb 12.8 oz (81.6 kg)    BMI 29.02 kg/m   Wt Readings from Last 3 Encounters:  11/14/19 179 lb 12.8 oz (81.6 kg)  07/19/19 177 lb 3.2 oz (80.4 kg)  06/10/19 177 lb 6.4 oz (80.5 kg)     BP Readings from Last 3 Encounters:  11/14/19 126/84  07/19/19 132/89  06/10/19 108/75     Physical Exam- Limited  Constitutional:  Body mass index is 29.02 kg/m. , not in acute distress, normal state of mind Eyes:  EOMI, no exophthalmos Neck: Supple Thyroid: No gross goiter Cardiovascular: RRR, no murmers, rubs, or gallops, no edema Respiratory: Adequate breathing efforts, no crackles, rales, rhonchi, or wheezing Musculoskeletal: no gross deformities, strength intact in all four extremities, no  gross restriction of joint movements Skin:  no rashes, no hyperemia Neurological: no tremor with outstretched hands   CMP ( most recent) CMP     Component Value Date/Time   NA 138 11/13/2019 1017   NA 133 (A) 11/13/2018 0000   K 4.4 11/13/2019 1017   CL 106 11/13/2019 1017   CO2 25 11/13/2019 1017   GLUCOSE 89 11/13/2019 1017   BUN 13 11/13/2019 1017   BUN 16 11/13/2018 0000   CREATININE 0.83 11/13/2019 1017   CALCIUM 9.3 11/13/2019 1017   PROT 7.1 11/13/2019 1017   ALBUMIN 4.4 11/13/2018 0000   AST 7 (L) 11/13/2019 1017   ALT 11 11/13/2019 1017   ALKPHOS 50 05/11/2009 1551   BILITOT 0.4 11/13/2019 1017   GFRNONAA 85 11/13/2019 1017   GFRAA 99 11/13/2019 1017     Diabetic Labs (most recent): Lab Results  Component Value Date   HGBA1C 7.2 (A) 11/14/2019   HGBA1C 10.2 (A) 06/10/2019   HGBA1C 12.1 (A) 12/13/2018     Assessment & Plan:   1. Uncontrolled type 2 diabetes - KAYBREE WILLIAMS has currently uncontrolled symptomatic type 2 DM since  44 years of age.  She presents today with her meter, no logs, showing greatly improved glycemic profile overall.  Review of her meter  shows 7-day average of 150, 14-day average of 148 and 30-day average of 148.  Her POCT A1C today is 7.2%, improving drastically from last visit of 10.2%.  She reports she has changed her diet and exercise which has contributed to this improvement.  She does have some frequent tight fasting glucose readings ranging 60-70s.  - Recent labs reviewed.  - I had a long discussion with her about the progressive nature of diabetes and the pathology behind its complications.  -She does not report gross complications, however she remains at a high risk for more acute and chronic complications which include CAD, CVA, CKD, retinopathy, and neuropathy. These are all discussed in detail with her.  - Nutritional counseling repeated at each appointment due to patients tendency to fall back in to old habits.  - The patient admits there is a room for improvement in their diet and drink choices. -  Suggestion is made for the patient to avoid simple carbohydrates from their diet including Cakes, Sweet Desserts / Pastries, Ice Cream, Soda (diet and regular), Sweet Tea, Candies, Chips, Cookies, Sweet Pastries,  Store Bought Juices, Alcohol in Excess of  1-2 drinks a day, Artificial Sweeteners, Coffee Creamer, and "Sugar-free" Products. This will help patient to have stable blood glucose profile and potentially avoid unintended weight gain.   - I encouraged the patient to switch to  unprocessed or minimally processed complex starch and increased protein intake (animal or plant source), fruits, and vegetables.   - Patient is advised to stick to a routine mealtimes to eat 3 meals  a day and avoid unnecessary snacks ( to snack only to correct hypoglycemia).  - I have approached her with the following individualized plan to manage  her diabetes and patient agrees:   -Given her improvement in glycemic control and tightening fasting glycemic profile, she will tolerate a decrease in her Tresiba to 60 units SQ daily at bedtime.   She is advised to continue Metformin 500 mg ER daily with breakfast and Glipizide 5 mg XL daily with breakfast.  -She is encouraged to continue monitoring blood glucose at least twice daily, before breakfast and before bed, and call the clinic if she  has readings less than 70 or greater than 200 for 3 tests in a row.  - Specific targets for  A1c;  LDL, HDL,  and Triglycerides were discussed with the patient.  2) Blood Pressure /Hypertension:  Her blood pressure is controlled to target.  She is not currently on any antihypertensive medications at this time.  She may benefit from addition of low dose ACE/ARB on subsequent visits for renal protection from diabetes.  3) Lipids/Hyperlipidemia:  Her most recent lipid panel from 11/13/19 shows controlled LDL of 77.  She is not on any cholesterol lowering medications at this time.  4)  Weight/Diet:  Her Body mass index is 29.02 kg/m. -  she is a candidate for some weight loss.  I discussed with her the fact that loss of 5 - 10% of her  current body weight will have the most impact on her diabetes management.  Exercise, and detailed carbohydrates information provided  -  detailed on discharge instructions.  5) Chronic Care/Health Maintenance: -she is not on ACEI/ARB and Statin medications and  is encouraged to initiate and continue to follow up with Ophthalmology, Dentist,  Podiatrist at least yearly or according to recommendations, and advised to  stay away from smoking. I have recommended yearly flu vaccine and pneumonia vaccine at least every 5 years; moderate intensity exercise for up to 150 minutes weekly; and  sleep for at least 7 hours a day.  - she is  advised to maintain close follow up with Patient, No Pcp Per for primary care needs, as well as her other providers for optimal and coordinated care.  - Time spent on this patient care encounter:  35 min, of which > 50% was spent in  counseling and the rest reviewing her blood glucose logs ,  discussing her hypoglycemia and hyperglycemia episodes, reviewing her current and  previous labs / studies  ( including abstraction from other facilities) and medications  doses and developing a  long term treatment plan and documenting her care.   Please refer to Patient Instructions for Blood Glucose Monitoring and Insulin/Medications Dosing Guide"  in media tab for additional information. Please  also refer to " Patient Self Inventory" in the Media  tab for reviewed elements of pertinent patient history.  Lonna Duval participated in the discussions, expressed understanding, and voiced agreement with the above plans.  All questions were answered to her satisfaction. she is encouraged to contact clinic should she have any questions or concerns prior to her return visit.   Follow up plan: - Return in about 4 months (around 03/16/2020) for Diabetes follow up with A1c in office, No previsit labs, ABI next visit.  Rayetta Pigg, [facility] [address] [place], [place] [address] Phone: [phone] Fax: [phone]  11/14/2019, 1:11 PM  This note was partially dictated with voice recognition software. Similar sounding words can be transcribed inadequately or may not  be corrected upon review.

## 2019-11-14 NOTE — Patient Instructions (Signed)

## 2019-12-12 ENCOUNTER — Other Ambulatory Visit: Payer: Self-pay | Admitting: "Endocrinology

## 2019-12-23 ENCOUNTER — Ambulatory Visit
Admission: EM | Admit: 2019-12-23 | Discharge: 2019-12-23 | Disposition: A | Discharge status: Home / Self Care | Payer: 59 | Attending: Emergency Medicine | Admitting: Emergency Medicine

## 2019-12-23 ENCOUNTER — Other Ambulatory Visit: Payer: Self-pay

## 2019-12-23 DIAGNOSIS — R519 Headache, unspecified: Secondary | ICD-10-CM

## 2019-12-23 DIAGNOSIS — R110 Nausea: Secondary | ICD-10-CM

## 2019-12-23 DIAGNOSIS — M542 Cervicalgia: Secondary | ICD-10-CM

## 2019-12-23 MED ORDER — ONDANSETRON 4 MG PO TBDP
4.0000 mg | ORAL_TABLET | Freq: Once | ORAL | Status: AC
  Administered 2019-12-23: 4 mg via ORAL

## 2019-12-23 MED ORDER — CYCLOBENZAPRINE HCL 10 MG PO TABS: 10.0000 mg | ORAL_TABLET | Freq: Every day | ORAL | 0 refills | Status: DC

## 2019-12-23 MED ORDER — ONDANSETRON HCL 4 MG PO TABS: 4.0000 mg | ORAL_TABLET | Freq: Four times a day (QID) | ORAL | 0 refills | Status: DC

## 2019-12-23 MED ORDER — DEXAMETHASONE SODIUM PHOSPHATE 10 MG/ML IJ SOLN
10.0000 mg | Freq: Once | INTRAMUSCULAR | Status: AC
  Administered 2019-12-23: 10 mg via INTRAMUSCULAR

## 2019-12-23 MED ORDER — KETOROLAC TROMETHAMINE 30 MG/ML IJ SOLN
30.0000 mg | Freq: Once | INTRAMUSCULAR | Status: AC
  Administered 2019-12-23: 30 mg via INTRAMUSCULAR

## 2019-12-23 MED ORDER — MELOXICAM 15 MG PO TABS: 15.0000 mg | ORAL_TABLET | Freq: Every day | ORAL | 0 refills | Status: DC

## 2019-12-23 NOTE — ED Triage Notes (Signed)
Pt has had headache after covid booster on the 15th. Pain has been intermittent and creates neck stiffness , currently no pain

## 2019-12-23 NOTE — Discharge Instructions (Addendum)
Migraine cocktail given in office Rest and drink plenty of fluids Mobic prescribed Flexeril prescribed.  This medication may make you drowsy, DO NOT DRIVE OR OPERATE heavy machinery while taking this medication zofran prescribed Follow up with PCP for recheck Return or go to the ER if you have any new or worsening symptoms such as fever, chills, nausea, vomiting, chest pain, shortness of breath, cough, vision changes, worsening headache despite treatment, slurred speech, facial asymmetry, weakness in arms or legs, etc..Marland Kitchen

## 2019-12-23 NOTE — ED Provider Notes (Signed)
[facility]   [id number] 12/23/19 Arrival Time: 4098  [id number]  SUBJECTIVE:  Brandi Oneill is a 44 y.o. female who complains of intermittent daily headaches x 1 week.  Denies head trauma.  Symptoms began after receiving covid booster.  Patient localizes her pain to the front and back of RT side of head.  Describes the pain as constant and dull in character.  Intermittently throbbing.  Patient has tried OTC without relief. Symptoms are made worse with light and being outside.  Reports similar symptoms in the past with sinus issues.  Complains of associated light-sensitivity, nausea, and RT sided neck discomfort.  Patient denies fever, chills, vomiting, aura, rhinorrhea, chest pain, SOB, abdominal pain, weakness, numbness or tingling, slurred speech.    ROS: As per HPI.  All other pertinent ROS negative.     Past Medical History:  Diagnosis Date  . Allergy   . Diabetes mellitus without complication ([place])    History reviewed. No pertinent surgical history. No Known Allergies No current facility-administered medications on file prior to encounter.   Current Outpatient Medications on File Prior to Encounter  Medication Sig Dispense Refill  . blood glucose meter kit and supplies Check blood glucose fasting, before lunch, before dinner and at bedtime 1 each 5  . glipiZIDE (GLUCOTROL XL) 5 MG 24 hr tablet Take 1 tablet by mouth once daily with breakfast 30 tablet 3  . insulin degludec (TRESIBA FLEXTOUCH) 100 UNIT/ML FlexTouch Pen Inject 60 Units into the skin at bedtime. 30 mL 3  . Insulin Pen Needle (B-D ULTRAFINE III SHORT PEN) 31G X 8 MM MISC 1 each by Does not apply route 4 (four) times daily. 100 each 3  . metFORMIN (GLUCOPHAGE-XR) 500 MG 24 hr tablet Take 1 tablet by mouth once daily with breakfast 30 tablet 3  . valACYclovir (VALTREX) 500 MG tablet Take 1 tablet (500 mg total) by mouth daily. 30 tablet 11   Social History   Socioeconomic History  . Marital status:  Single    Spouse name: Not on file  . Number of children: Not on file  . Years of education: Not on file  . Highest education level: Not on file  Occupational History  . Occupation: shipping & receiving  Tobacco Use  . Smoking status: Never Smoker  . Smokeless tobacco: Never Used  Substance and Sexual Activity  . Alcohol use: Not Currently  . Drug use: Not Currently  . Sexual activity: Yes  Other Topics Concern  . Not on file  Social History Narrative  . Not on file   Social Determinants of Health   Financial Resource Strain:   . Difficulty of Paying Living Expenses: Not on file  Food Insecurity:   . Worried About Charity fundraiser in the Last Year: Not on file  . Ran Out of Food in the Last Year: Not on file  Transportation Needs:   . Lack of Transportation (Medical): Not on file  . Lack of Transportation (Non-Medical): Not on file  Physical Activity:   . Days of Exercise per Week: Not on file  . Minutes of Exercise per Session: Not on file  Stress:   . Feeling of Stress : Not on file  Social Connections:   . Frequency of Communication with Friends and Family: Not on file  . Frequency of Social Gatherings with Friends and Family: Not on file  . Attends Religious Services: Not on file  . Active Member of Clubs or Organizations: Not on  file  . Attends Archivist Meetings: Not on file  . Marital Status: Not on file  Intimate Partner Violence:   . Fear of Current or Ex-Partner: Not on file  . Emotionally Abused: Not on file  . Physically Abused: Not on file  . Sexually Abused: Not on file   Family History  Problem Relation Age of Onset  . Diabetes Mother   . Diabetes Father   . Hypertension Father     OBJECTIVE:  Vitals:   12/23/19 1147  BP: (!) 138/93  Pulse: 89  Resp: 20  Temp: 98.4 F (36.9 C)  SpO2: 97%    General appearance: alert; no distress Eyes: PERRLA; EOMI HENT: normocephalic; atraumatic; EACs clear, TMs pearly gray; nares patent;  oropharynx clear Neck: supple with FROM Lungs: clear to auscultation bilaterally Heart: regular rate and rhythm.   Extremities: no edema; symmetrical with no gross deformities Skin: warm and dry Neurologic: CN 2-12 grossly intact; finger to nose without difficulty; normal gait; strength and sensation intact bilaterally about the upper and lower extremities; negative pronator drift Psychological: alert and cooperative; normal mood and affect   ASSESSMENT & PLAN:  1. Acute nonintractable headache, unspecified headache type   2. Nausea without vomiting   3. Neck pain     Meds ordered this encounter  Medications  . meloxicam (MOBIC) 15 MG tablet    Sig: Take 1 tablet (15 mg total) by mouth daily.    Dispense:  30 tablet    Refill:  0    Order Specific Question:   Supervising Provider    Answer:   Raylene Everts [[id number]]  . cyclobenzaprine (FLEXERIL) 10 MG tablet    Sig: Take 1 tablet (10 mg total) by mouth at bedtime.    Dispense:  15 tablet    Refill:  0    Order Specific Question:   Supervising Provider    Answer:   Raylene Everts [[id number]]  . ondansetron (ZOFRAN) 4 MG tablet    Sig: Take 1 tablet (4 mg total) by mouth every 6 (six) hours.    Dispense:  12 tablet    Refill:  0    Order Specific Question:   Supervising Provider    Answer:   Raylene Everts [[id number]]  . ketorolac (TORADOL) 30 MG/ML injection 30 mg  . dexamethasone (DECADRON) injection 10 mg  . ondansetron (ZOFRAN-ODT) disintegrating tablet 4 mg    Migraine cocktail given in office Rest and drink plenty of fluids Mobic prescribed Flexeril prescribed.  This medication may make you drowsy, DO NOT DRIVE OR OPERATE heavy machinery while taking this medication zofran prescribed Follow up with PCP for recheck Return or go to the ER if you have any new or worsening symptoms such as fever, chills, nausea, vomiting, chest pain, shortness of breath, cough, vision changes, worsening headache despite  treatment, slurred speech, facial asymmetry, weakness in arms or legs, etc...  Reviewed expectations re: course of current medical issues. Questions answered. Outlined signs and symptoms indicating need for more acute intervention. Patient verbalized understanding. After Visit Summary given.   Lestine Box, PA-C 12/23/19 1225

## 2019-12-25 ENCOUNTER — Other Ambulatory Visit: Payer: Self-pay | Admitting: Family Medicine

## 2020-01-01 ENCOUNTER — Other Ambulatory Visit: Payer: Self-pay

## 2020-01-01 DIAGNOSIS — Z794 Long term (current) use of insulin: Secondary | ICD-10-CM

## 2020-01-01 DIAGNOSIS — E118 Type 2 diabetes mellitus with unspecified complications: Secondary | ICD-10-CM

## 2020-01-01 MED ORDER — ONETOUCH ULTRA VI STRP: 1.0000 | ORAL_STRIP | Freq: Two times a day (BID) | 3 refills | Status: AC

## 2020-03-03 ENCOUNTER — Encounter: Payer: Self-pay | Admitting: Internal Medicine

## 2020-03-03 ENCOUNTER — Other Ambulatory Visit: Payer: Self-pay

## 2020-03-03 ENCOUNTER — Ambulatory Visit (INDEPENDENT_AMBULATORY_CARE_PROVIDER_SITE_OTHER): Payer: 59 | Admitting: Internal Medicine

## 2020-03-03 ENCOUNTER — Ambulatory Visit (HOSPITAL_COMMUNITY)
Admission: RE | Admit: 2020-03-03 | Discharge: 2020-03-03 | Disposition: A | Discharge status: Home / Self Care | Payer: 59 | Source: Ambulatory Visit | Attending: Internal Medicine | Admitting: Internal Medicine

## 2020-03-03 VITALS — BP 132/80 | HR 99 | Temp 99.4°F | Ht 66.0 in | Wt 184.0 lb

## 2020-03-03 DIAGNOSIS — G8929 Other chronic pain: Secondary | ICD-10-CM | POA: Insufficient documentation

## 2020-03-03 DIAGNOSIS — E118 Type 2 diabetes mellitus with unspecified complications: Secondary | ICD-10-CM

## 2020-03-03 DIAGNOSIS — Z135 Encounter for screening for eye and ear disorders: Secondary | ICD-10-CM

## 2020-03-03 DIAGNOSIS — N951 Menopausal and female climacteric states: Secondary | ICD-10-CM

## 2020-03-03 DIAGNOSIS — Z7689 Persons encountering health services in other specified circumstances: Secondary | ICD-10-CM

## 2020-03-03 DIAGNOSIS — M25511 Pain in right shoulder: Secondary | ICD-10-CM | POA: Insufficient documentation

## 2020-03-03 DIAGNOSIS — Z794 Long term (current) use of insulin: Secondary | ICD-10-CM

## 2020-03-03 DIAGNOSIS — B351 Tinea unguium: Secondary | ICD-10-CM | POA: Insufficient documentation

## 2020-03-03 MED ORDER — IBUPROFEN 600 MG PO TABS: 600.0000 mg | ORAL_TABLET | Freq: Two times a day (BID) | ORAL | 0 refills | Status: AC | PRN

## 2020-03-03 NOTE — Assessment & Plan Note (Signed)
HbA1C: 7.2 On Tresiba 60 U qHS, Metformin 500 mg QD and Glipizide 5 mg QD Advised to follow diabetic diet F/u CMP and lipid panel Diabetic eye exam: Advised to follow up with Ophthalmology for diabetic eye exam

## 2020-03-03 NOTE — Patient Instructions (Addendum)
Please apply Lotrimin cream for toe infection.  Please continue to take medications as prescribed.  You are being referred to OB/GYN for PAP smear and evaluation of perimenopause.  You are being referred to Ophthalmology for diabetic eye exam.  Please continue to follow low carbohydrate diet.

## 2020-03-03 NOTE — Assessment & Plan Note (Signed)
Constant right shoulder pain, denies any injury Reports sleeping over left side, which provoked the pain. Painful ROM Check X-ray right shoulder Ibuprofen PRN If persistent with insignificant X-ray, will get MRI of the right shoulder.

## 2020-03-03 NOTE — Assessment & Plan Note (Signed)
Advised to use Lotrimin cream Keep the area clean and dry Apply lotion over the foot area to avoid itching

## 2020-03-03 NOTE — Progress Notes (Signed)
New Patient Office Visit  Subjective:  Patient ID: Brandi Oneill, female    DOB: 1975/08/10  Age: 45 y.o. MRN: [id number]  CC:  Chief Complaint  Patient presents with  . New Patient (Initial Visit)    Here to establish care. Complains of R arm pain, ongoing x3 months.     HPI Brandi Oneill is a 45 year old female with PMH of type 2 DM who presents for establishing care.  She c/o right shoulder pain for about 3-4 months. She states that she slept over right side and woke up with severe right shoulder pain, which has been constant. Her shoulder pain is about 5/10 currently, but up to 10/10 upon movement. She is able to move right shoulder up to 60 degrees with moderate pain. She denies any known injury. Denies any swelling or redness in the area. She has tried Mobic and Flexeril with no change in pain. She mentions mild numbness in the fifth digit, which is intermittent.  She also c/o menstrual irregularities and hot flashes. She denies any heavy bleeding currently. She has not had PAP smear for many years.  She has h/o type 2 DM and sees Dr Dorris Fetch for DM management. Her last HbA1C was 7.2 in 11/2019. She has been taking [place], Metformin and Glipizide.  She c/o intermittent numbness over her feet, but does not want to take any medication for it yet. She also has toe fungal infection, for which she has tried using Kerasal.  Last Mammography in 12/2018.  She is up-to-date with COVID vaccine and flu vaccine. She also has had Pneumonia vaccine, unsure of detail - states that she will try to get vaccination record in the next visit.  Past Medical History:  Diagnosis Date  . Allergy   . Diabetes mellitus without complication ([place])     History reviewed. No pertinent surgical history.  Family History  Problem Relation Age of Onset  . Diabetes Mother   . Diabetes Father   . Hypertension Father     Social History   Socioeconomic History  . Marital status: Single    Spouse  name: Not on file  . Number of children: Not on file  . Years of education: Not on file  . Highest education level: Not on file  Occupational History  . Occupation: shipping & receiving  Tobacco Use  . Smoking status: Never Smoker  . Smokeless tobacco: Never Used  Substance and Sexual Activity  . Alcohol use: Not Currently  . Drug use: Not Currently  . Sexual activity: Yes  Other Topics Concern  . Not on file  Social History Narrative  . Not on file   Social Determinants of Health   Financial Resource Strain: Not on file  Food Insecurity: Not on file  Transportation Needs: Not on file  Physical Activity: Not on file  Stress: Not on file  Social Connections: Not on file  Intimate Partner Violence: Not on file    ROS Review of Systems  Constitutional: Negative for chills and fever.  HENT: Negative for congestion, sinus pressure, sinus pain and sore throat.   Eyes: Negative for pain and discharge.  Respiratory: Negative for cough and shortness of breath.   Cardiovascular: Negative for chest pain and palpitations.  Gastrointestinal: Negative for abdominal pain, constipation, diarrhea, nausea and vomiting.  Endocrine: Negative for polydipsia and polyuria.  Genitourinary: Positive for menstrual problem. Negative for dysuria, hematuria and pelvic pain.  Musculoskeletal: Positive for arthralgias (right shoulder). Negative for neck pain  and neck stiffness.  Skin: Negative for rash.  Neurological: Negative for dizziness and weakness.  Psychiatric/Behavioral: Negative for agitation and behavioral problems.    Objective:   Today's Vitals: BP 132/80 (BP Location: Left Arm, Patient Position: Sitting)   Pulse 99   Temp 99.4 F (37.4 C) (Temporal)   Ht 5' 6" (1.676 m)   Wt 184 lb (83.5 kg)   LMP 02/01/2020   SpO2 96%   BMI 29.70 kg/m   Physical Exam Vitals reviewed.  Constitutional:      General: She is not in acute distress.    Appearance: She is not diaphoretic.   HENT:     Head: Normocephalic and atraumatic.     Nose: Nose normal.     Mouth/Throat:     Mouth: Mucous membranes are moist.  Eyes:     General: No scleral icterus.    Extraocular Movements: Extraocular movements intact.     Pupils: Pupils are equal, round, and reactive to light.  Cardiovascular:     Rate and Rhythm: Normal rate and regular rhythm.     Pulses: Normal pulses.     Heart sounds: Normal heart sounds. No murmur heard.   Pulmonary:     Breath sounds: Normal breath sounds. No wheezing or rales.  Musculoskeletal:     Right shoulder: No bony tenderness. Decreased range of motion (Due to pain, active and passive).     Left shoulder: Normal. No bony tenderness. Normal range of motion.     Cervical back: Neck supple. No tenderness.     Right lower leg: No edema.     Left lower leg: No edema.  Skin:    General: Skin is warm.     Findings: No rash.  Neurological:     General: No focal deficit present.     Mental Status: She is alert and oriented to person, place, and time.     Sensory: No sensory deficit.     Motor: No weakness.  Psychiatric:        Mood and Affect: Mood normal.        Behavior: Behavior normal.      Assessment & Plan:   Problem List Items Addressed This Visit      Encounter to establish care - Primary   Care established Previous chart reviewed History and medications reviewed with the patient       Endocrine   Controlled type 2 diabetes mellitus with complication, with long-term current use of insulin (HCC)    HbA1C: 7.2 On Tresiba 60 U qHS, Metformin 500 mg QD and Glipizide 5 mg QD Advised to follow diabetic diet F/u CMP and lipid panel Diabetic eye exam: Advised to follow up with Ophthalmology for diabetic eye exam         Musculoskeletal and Integument   Onychomycosis    Advised to use Lotrimin cream Keep the area clean and dry Apply lotion over the foot area to avoid itching        Other         Chronic right shoulder  pain    Constant right shoulder pain, denies any injury Reports sleeping over left side, which provoked the pain. Painful ROM Check X-ray right shoulder Ibuprofen PRN If persistent with insignificant X-ray, will get MRI of the right shoulder.      Relevant Medications   ibuprofen (ADVIL) 600 MG tablet   Other Relevant Orders   DG Shoulder Right   Perimenopause    Hot flashes and  menstrual irregularity Counseled about usual progression of symptoms OB/GYN referral provided      Relevant Orders   Ambulatory referral to Obstetrics / Gynecology    Other Visit Diagnoses    Screening for eye condition       Relevant Orders   Ambulatory referral to Ophthalmology      Outpatient Encounter Medications as of 03/03/2020  Medication Sig  . blood glucose meter kit and supplies Check blood glucose fasting, before lunch, before dinner and at bedtime  . glipiZIDE (GLUCOTROL XL) 5 MG 24 hr tablet Take 1 tablet by mouth once daily with breakfast  . ibuprofen (ADVIL) 600 MG tablet Take 1 tablet (600 mg total) by mouth 2 (two) times daily as needed for moderate pain.  Marland Kitchen insulin degludec (TRESIBA FLEXTOUCH) 100 UNIT/ML FlexTouch Pen Inject 60 Units into the skin at bedtime.  . Insulin Pen Needle (B-D ULTRAFINE III SHORT PEN) 31G X 8 MM MISC 1 each by Does not apply route 4 (four) times daily.  . metFORMIN (GLUCOPHAGE-XR) 500 MG 24 hr tablet Take 1 tablet by mouth once daily with breakfast  . ONETOUCH ULTRA test strip 1 each by Other route 2 (two) times daily. DX E11.8  . valACYclovir (VALTREX) 500 MG tablet Take 1 tablet (500 mg total) by mouth daily.  . [DISCONTINUED] cyclobenzaprine (FLEXERIL) 10 MG tablet Take 1 tablet (10 mg total) by mouth at bedtime. (Patient not taking: Reported on 03/03/2020)  . [DISCONTINUED] meloxicam (MOBIC) 15 MG tablet Take 1 tablet (15 mg total) by mouth daily. (Patient not taking: Reported on 03/03/2020)  . [DISCONTINUED] ondansetron (ZOFRAN) 4 MG tablet Take 1 tablet (4  mg total) by mouth every 6 (six) hours. (Patient not taking: Reported on 03/03/2020)   No facility-administered encounter medications on file as of 03/03/2020.    Follow-up: Return in about 6 weeks (around 04/14/2020) for Right shoulder pain.   Lindell Spar, MD

## 2020-03-03 NOTE — Assessment & Plan Note (Signed)
Hot flashes and menstrual irregularity Counseled about usual progression of symptoms OB/GYN referral provided

## 2020-03-03 NOTE — Assessment & Plan Note (Signed)
Care established Previous chart reviewed History and medications reviewed with the patient 

## 2020-03-04 ENCOUNTER — Other Ambulatory Visit: Payer: Self-pay | Admitting: *Deleted

## 2020-03-04 DIAGNOSIS — M545 Low back pain, unspecified: Secondary | ICD-10-CM

## 2020-03-04 DIAGNOSIS — G8929 Other chronic pain: Secondary | ICD-10-CM

## 2020-03-04 NOTE — Progress Notes (Signed)
abr Brandi Oneill

## 2020-03-10 ENCOUNTER — Ambulatory Visit: Payer: 59 | Admitting: Orthopaedic Surgery

## 2020-03-12 ENCOUNTER — Other Ambulatory Visit: Payer: Self-pay | Admitting: Physician Assistant

## 2020-03-12 DIAGNOSIS — E11618 Type 2 diabetes mellitus with other diabetic arthropathy: Secondary | ICD-10-CM

## 2020-03-12 DIAGNOSIS — M7501 Adhesive capsulitis of right shoulder: Secondary | ICD-10-CM

## 2020-03-12 NOTE — Progress Notes (Signed)
ot

## 2020-03-14 ENCOUNTER — Other Ambulatory Visit: Payer: Self-pay | Admitting: Family Medicine

## 2020-03-18 ENCOUNTER — Ambulatory Visit (HOSPITAL_COMMUNITY): Payer: 59 | Attending: Physician Assistant | Admitting: Occupational Therapy

## 2020-03-18 ENCOUNTER — Other Ambulatory Visit: Payer: Self-pay

## 2020-03-18 ENCOUNTER — Encounter (HOSPITAL_COMMUNITY): Payer: Self-pay | Admitting: Occupational Therapy

## 2020-03-18 DIAGNOSIS — M25611 Stiffness of right shoulder, not elsewhere classified: Secondary | ICD-10-CM | POA: Diagnosis present

## 2020-03-18 DIAGNOSIS — R29898 Other symptoms and signs involving the musculoskeletal system: Secondary | ICD-10-CM | POA: Diagnosis present

## 2020-03-18 DIAGNOSIS — M25511 Pain in right shoulder: Secondary | ICD-10-CM | POA: Insufficient documentation

## 2020-03-18 NOTE — Patient Instructions (Signed)
1) SHOULDER: Flexion On Table   Place hands on towel placed on table, elbows straight. Lean forward with you upper body, pushing towel away from body.  _10-15__ reps per set, __3_ sets per day  2) Abduction (Passive)   With arm out to side, resting on towel placed on table with palm DOWN, keeping trunk away from table, lean to the side while pushing towel away from body.  Repeat _10-15___ times. Do __3__ sessions per day.  Copyright  VHI. All rights reserved.     3) Internal Rotation (Assistive)   Seated with elbow bent at right angle and held against side, slide arm on table surface in an inward arc keeping elbow anchored in place. Repeat __10-15__ times. Do __3__ sessions per day. Activity: Use this motion to brush crumbs off the table.  Copyright  VHI. All rights reserved.   

## 2020-03-18 NOTE — Therapy (Signed)
[facility] [address] [place], [place], [address] Phone: [phone]   Fax:  [phone]  Occupational Therapy Evaluation  Patient Details  Name: Brandi Oneill MRN: [id number] Date of Birth: [birth year] Referring Provider (OT): Julien Girt, [place]   Encounter Date: 03/18/2020   OT End of Session - 03/18/20 5093    Visit Number 1    Number of Visits 16    Date for OT Re-Evaluation 05/17/20   mini-reassessment 04/15/20   Authorization Type [employer]    Authorization Time Period 30 visit limit    Authorization - Visit Number 1    Authorization - Number of Visits 30    OT Start Time 0739    OT Stop Time 0817    OT Time Calculation (min) 38 min    Activity Tolerance Patient tolerated treatment well    Behavior During Therapy [facility] for tasks assessed/performed           Past Medical History:  Diagnosis Date  . Allergy   . Diabetes mellitus without complication (HCC)     History reviewed. No pertinent surgical history.  There were no vitals filed for this visit.   Subjective Assessment - 03/18/20 0819    Subjective  S: I've had this before but it just went away that time.    Pertinent History Pt is a 45 y/o female presenting with right adhesive capsulitis that began approximately 4 months ago, pt woke up one morning with severe shoulder pain. Pt reports she had a cortisone shot that provided minimal relief. Pt was referred to occupational therapy for evaluation and treatment by Julien Girt, PA.    Special Tests FOTO: 52/100    Patient Stated Goals To have less pain and more mobility in the shoulder.    Currently in Pain? Yes    Pain Score 4     Pain Location Shoulder    Pain Orientation Right    Pain Descriptors / Indicators Aching;Sore;Throbbing    Pain Type Acute pain    Pain Radiating Towards neck    Pain Onset More than a month ago    Pain Frequency Constant    Aggravating Factors  movement, use    Pain  Relieving Factors rest, heat, pain medication    Effect of Pain on Daily Activities mod to max effect on ADL completion    Multiple Pain Sites No             [facility] OT Assessment - 03/18/20 0740      Assessment   Medical Diagnosis right adhesive capsulitis    Referring Provider (OT) Julien Girt, PA    Onset Date/Surgical Date 09/26/19    Hand Dominance Right    Next MD Visit March    Prior Therapy None      Precautions   Precautions None      Restrictions   Weight Bearing Restrictions No      Balance Screen   Has the patient fallen in the past 6 months No      Prior Function   Level of Independence Independent    Vocation Full time employment    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 Pt is waking up at night due to pain in the shoulder.  Written Expression   Dominant Hand Right      Cognition   Overall Cognitive Status Within Functional Limits for tasks assessed      Observation/Other Assessments   Focus on Therapeutic Outcomes (FOTO)  52/100      ROM / Strength   AROM / PROM / Strength AROM;PROM;Strength      Palpation   Palpation comment mod to max fascial restrictions in upper arm, anterior shoulder, trapezius, and scapular regions      AROM   Overall AROM Comments Assessed seated, er/IR adducted    AROM Assessment Site Shoulder    Right/Left Shoulder Right    Right Shoulder Flexion 85 Degrees    Right Shoulder ABduction 60 Degrees    Right Shoulder Internal Rotation 90 Degrees    Right Shoulder External Rotation 27 Degrees      PROM   Overall PROM Comments Assessed supine, er/IR adducted    PROM Assessment Site Shoulder    Right/Left Shoulder Right    Right Shoulder Flexion 95 Degrees    Right Shoulder ABduction 66 Degrees    Right  Shoulder Internal Rotation 90 Degrees    Right Shoulder External Rotation 18 Degrees      Strength   Overall Strength Comments Assessed seated, er/IR adducted    Strength Assessment Site Shoulder    Right/Left Shoulder Right    Right Shoulder Flexion 3-/5    Right Shoulder ABduction 3-/5    Right Shoulder Internal Rotation 3/5    Right Shoulder External Rotation 3-/5                           OT Education - 03/18/20 0811    Education Details table slides, heat and massage    Person(s) Educated Patient    Methods Explanation;Demonstration;Handout    Comprehension Verbalized understanding;Returned demonstration            OT Short Term Goals - 03/18/20 0826      OT SHORT TERM GOAL #1   Title Pt will be provided with and educated on HEP to improve mobility required for RUE use during ADLs.    Time 4    Period Weeks    Status New    Target Date 04/17/20      OT SHORT TERM GOAL #2   Title Pt will increase RUE P/ROM to [facility] to improve ability to perform dressing tasks without compensatory techniques.    Time 4    Period Weeks    Status New      OT SHORT TERM GOAL #3   Title Pt will increase RUE strength to 3+/5 to improve ability to reach items at chest to head height in cabinets.    Time 4    Period Weeks    Status New             OT Long Term Goals - 03/18/20 0827      OT LONG TERM GOAL #1   Title Pt will decrease RUE pain to 3/10 or less to improve ability to sleep for 4+ consecutive hours without waking due to pain.    Time 8    Period Weeks    Status New    Target Date 05/17/20      OT LONG TERM GOAL #2   Title Pt will decrease RUE fascial restrictions to minimal amounts or less to improve mobility required for functional reaching tasks.    Time 8  Period Weeks    Status New      OT LONG TERM GOAL #3   Title Pt will increase RUE A/ROM to [facility] to improve ability to reach overhead and behind back during dressing and bathing tasks.     Time 8    Period Weeks    Status New      OT LONG TERM GOAL #4   Title Pt will increase RUE strength to 4+/5 to improve ability to perform cleaning tasks at work and home.    Time 8    Period Weeks    Status New                 Plan - 03/18/20 4193    Clinical Impression Statement A: Pt is a 45 y/o female presenting with right adhesive capsulitis present for approximately 4 months limiting use of RUE during ADLs and work tasks. Pt reports severe pain, limited ROM and strength preventing functional use.    OT Occupational Profile and History Problem Focused Assessment - Including review of records relating to presenting problem    Occupational performance deficits (Please refer to evaluation for details): ADL's;IADL's;Rest and Sleep;Work;Leisure    Body Structure / Function / Physical Skills ADL;Endurance;UE functional use;Fascial restriction;Pain;ROM;IADL;Strength;Mobility    Rehab Potential Good    Clinical Decision Making Limited treatment options, no task modification necessary    Comorbidities Affecting Occupational Performance: None    Modification or Assistance to Complete Evaluation  No modification of tasks or assist necessary to complete eval    OT Frequency 2x / week    OT Duration 8 weeks    OT Treatment/Interventions Self-care/ADL training;Ultrasound;Patient/family education;Passive range of motion;Cryotherapy;Electrical Stimulation;Moist Heat;Therapeutic exercise;Manual Therapy;Therapeutic activities    Plan P: Pt will benefit from skilled OT services to decrease pain and fascial restrictions, increase ROM, strength, and functional use of RUE as dominant during ADLs. Treatment plan: myofascial release and manual techniques, P/ROM, AA/ROM, A/ROM, general RUE strengthening, scapular mobility/stability/strengthening, modalities prn    OT Home Exercise Plan eval: table slides, heat/massage    Consulted and Agree with Plan of Care Patient           Patient will benefit  from skilled therapeutic intervention in order to improve the following deficits and impairments:   Body Structure / Function / Physical Skills: ADL,Endurance,UE functional use,Fascial restriction,Pain,ROM,IADL,Strength,Mobility       Visit Diagnosis: Acute pain of right shoulder  Stiffness of right shoulder, not elsewhere classified  Other symptoms and signs involving the musculoskeletal system    Problem List Patient Active Problem List   Diagnosis Date Noted  . Chronic right shoulder pain 03/03/2020  . Perimenopause 03/03/2020  . Onychomycosis 03/03/2020  . Leg cramps 07/19/2019  . Essential hypertension, benign 02/04/2019  . Chronic midline low back pain without sciatica 01/13/2019  . Controlled type 2 diabetes mellitus with complication, with long-term current use of insulin (HCC) 12/13/2018  . Encounter to establish care 12/13/2018  . Thyroid disease 12/13/2018   Ezra Sites, OTR/L  [phone] 03/18/2020, 8:30 AM  [facility] [address] [place], [place], [address] Phone: [phone]   Fax:  [phone]  Name: Brandi Oneill MRN: [id number] Date of Birth: August 12, 1975

## 2020-03-20 ENCOUNTER — Telehealth: Payer: Self-pay

## 2020-03-20 ENCOUNTER — Other Ambulatory Visit: Payer: Self-pay | Admitting: *Deleted

## 2020-03-20 MED ORDER — VALACYCLOVIR HCL 500 MG PO TABS: 500.0000 mg | ORAL_TABLET | Freq: Every day | ORAL | 11 refills | Status: DC

## 2020-03-20 NOTE — Telephone Encounter (Signed)
Pt medication sent to pharmacy  

## 2020-03-20 NOTE — Telephone Encounter (Signed)
Pt needs Valtrex called --[employer]

## 2020-03-24 ENCOUNTER — Ambulatory Visit (HOSPITAL_COMMUNITY): Payer: 59 | Admitting: Occupational Therapy

## 2020-03-24 ENCOUNTER — Ambulatory Visit (HOSPITAL_COMMUNITY): Payer: 59

## 2020-03-24 ENCOUNTER — Encounter (HOSPITAL_COMMUNITY): Payer: Self-pay

## 2020-03-24 ENCOUNTER — Other Ambulatory Visit: Payer: Self-pay

## 2020-03-24 DIAGNOSIS — M25611 Stiffness of right shoulder, not elsewhere classified: Secondary | ICD-10-CM

## 2020-03-24 DIAGNOSIS — M25511 Pain in right shoulder: Secondary | ICD-10-CM

## 2020-03-24 DIAGNOSIS — R29898 Other symptoms and signs involving the musculoskeletal system: Secondary | ICD-10-CM

## 2020-03-24 NOTE — Therapy (Signed)
[facility] [address] [place], [place], [address] Phone: [phone]   Fax:  [phone]  Occupational Therapy Treatment  Patient Details  Name: Brandi Oneill MRN: [id number] Date of Birth: 05/31/1975 Referring Provider (OT): Julien Girt, [place]   Encounter Date: 03/24/2020   OT End of Session - 03/24/20 1718    Visit Number 2    Number of Visits 16    Date for OT Re-Evaluation 05/17/20   mini-reassessment 04/15/20   Authorization Type [employer]    Authorization Time Period 30 visit limit    Authorization - Visit Number 2    Authorization - Number of Visits 30    OT Start Time 1648    OT Stop Time 1728    OT Time Calculation (min) 40 min    Activity Tolerance Patient tolerated treatment well    Behavior During Therapy [facility] for tasks assessed/performed           Past Medical History:  Diagnosis Date  . Allergy   . Diabetes mellitus without complication (HCC)     History reviewed. No pertinent surgical history.  There were no vitals filed for this visit.   Subjective Assessment - 03/24/20 1822    Subjective  S: It doesn't hurt right now but if I move and stretch it, it will.    Currently in Pain? No/denies              [facility] OT Assessment - 03/24/20 1706      Assessment   Medical Diagnosis right adhesive capsulitis      Precautions   Precautions None                    OT Treatments/Exercises (OP) - 03/24/20 1706      Exercises   Exercises Shoulder      Shoulder Exercises: Supine   Protraction PROM;AAROM;10 reps    Horizontal ABduction PROM;AAROM;10 reps    External Rotation PROM;AAROM;10 reps    Internal Rotation PROM;AAROM;10 reps    Flexion PROM;AAROM;10 reps    ABduction PROM;10 reps      Shoulder Exercises: Seated   Row AROM;10 reps      Manual Therapy   Manual Therapy Myofascial release    Manual therapy comments Manual therapy completed prior to exercises.    Myofascial  Release Myofascial release and manual stretching completing to right upper arm, trapezius, and scapularis region to decrease fascial restrictions and increase joint mobility in a pain free zone.                  OT Education - 03/24/20 1718    Education Details reviewed therapy goals    Person(s) Educated Patient    Methods Explanation    Comprehension Verbalized understanding            OT Short Term Goals - 03/24/20 1651      OT SHORT TERM GOAL #1   Title Pt will be provided with and educated on HEP to improve mobility required for RUE use during ADLs.    Time 4    Period Weeks    Status On-going    Target Date 04/17/20      OT SHORT TERM GOAL #2   Title Pt will increase RUE P/ROM to [facility] to improve ability to perform dressing tasks without compensatory techniques.    Time 4    Period Weeks    Status On-going      OT  SHORT TERM GOAL #3   Title Pt will increase RUE strength to 3+/5 to improve ability to reach items at chest to head height in cabinets.    Time 4    Period Weeks    Status On-going             OT Long Term Goals - 03/24/20 1651      OT LONG TERM GOAL #1   Title Pt will decrease RUE pain to 3/10 or less to improve ability to sleep for 4+ consecutive hours without waking due to pain.    Time 8    Period Weeks    Status On-going      OT LONG TERM GOAL #2   Title Pt will decrease RUE fascial restrictions to minimal amounts or less to improve mobility required for functional reaching tasks.    Time 8    Period Weeks    Status On-going      OT LONG TERM GOAL #3   Title Pt will increase RUE A/ROM to [facility] to improve ability to reach overhead and behind back during dressing and bathing tasks.    Time 8    Period Weeks    Status On-going      OT LONG TERM GOAL #4   Title Pt will increase RUE strength to 4+/5 to improve ability to perform cleaning tasks at work and home.    Time 8    Period Weeks    Status On-going                  Plan - 03/24/20 1824    Clinical Impression Statement A: Initiated myofascial release and manual stretching this session with patient able to tolerate slightly greater than 90 degress flexion and abduction. Completed shoulder AA/ROM supine for all ranges except abduction. VC form and technique. Manual techniques completed to address fascial restriction in the right upper arm and upper trapezius region. Discussed benefits of heat and ice for pain management and decreasing muscle tightness.    Body Structure / Function / Physical Skills ADL;Endurance;UE functional use;Fascial restriction;Pain;ROM;IADL;Strength;Mobility    Plan P: Continue with supine AA/ROM. Attempt abduction for AA/ROM. Update HEP if able to tolerate and complete with good form and technique.           Patient will benefit from skilled therapeutic intervention in order to improve the following deficits and impairments:   Body Structure / Function / Physical Skills: ADL,Endurance,UE functional use,Fascial restriction,Pain,ROM,IADL,Strength,Mobility       Visit Diagnosis: Stiffness of right shoulder, not elsewhere classified  Other symptoms and signs involving the musculoskeletal system  Acute pain of right shoulder    Problem List Patient Active Problem List   Diagnosis Date Noted  . Chronic right shoulder pain 03/03/2020  . Perimenopause 03/03/2020  . Onychomycosis 03/03/2020  . Leg cramps 07/19/2019  . Essential hypertension, benign 02/04/2019  . Chronic midline low back pain without sciatica 01/13/2019  . Controlled type 2 diabetes mellitus with complication, with long-term current use of insulin (HCC) 12/13/2018  . Encounter to establish care 12/13/2018  . Thyroid disease 12/13/2018    Limmie Patricia, OTR/L,CBIS  [phone]  03/24/2020, 6:28 PM  [facility] [address] [place], [place], [address] Phone: [phone]   Fax:  [phone]  Name: Brandi Oneill MRN: [id number] Date of Birth: 1975-03-26

## 2020-03-25 ENCOUNTER — Ambulatory Visit: Payer: 59 | Admitting: Nurse Practitioner

## 2020-03-25 NOTE — Patient Instructions (Incomplete)

## 2020-03-27 ENCOUNTER — Encounter: Payer: Self-pay | Admitting: Nurse Practitioner

## 2020-03-27 ENCOUNTER — Ambulatory Visit (INDEPENDENT_AMBULATORY_CARE_PROVIDER_SITE_OTHER): Payer: 59 | Admitting: Nurse Practitioner

## 2020-03-27 ENCOUNTER — Other Ambulatory Visit: Payer: Self-pay

## 2020-03-27 VITALS — BP 143/92 | HR 90 | Ht 66.0 in | Wt 183.6 lb

## 2020-03-27 DIAGNOSIS — E118 Type 2 diabetes mellitus with unspecified complications: Secondary | ICD-10-CM

## 2020-03-27 DIAGNOSIS — E559 Vitamin D deficiency, unspecified: Secondary | ICD-10-CM

## 2020-03-27 DIAGNOSIS — I10 Essential (primary) hypertension: Secondary | ICD-10-CM

## 2020-03-27 DIAGNOSIS — Z794 Long term (current) use of insulin: Secondary | ICD-10-CM

## 2020-03-27 LAB — POCT GLYCOSYLATED HEMOGLOBIN (HGB A1C): HbA1c, POC (controlled diabetic range): 9.3 % — AB (ref 0.0–7.0)

## 2020-03-27 LAB — POCT UA - MICROALBUMIN: Microalbumin Ur, POC: 10 mg/L

## 2020-03-27 NOTE — Progress Notes (Signed)
03/27/2020, 10:42 AM         Endocrinology follow-up note   Subjective:    Patient ID: Brandi Oneill, female    DOB: 30-Oct-1975.  Brandi Oneill is being seen in follow-up for management of currently uncontrolled symptomatic diabetes requested by  Lindell Spar, MD.   Past Medical History:  Diagnosis Date  . Allergy   . Diabetes mellitus without complication ([facility])     History reviewed. No pertinent surgical history.  Social History   Socioeconomic History  . Marital status: Single    Spouse name: Not on file  . Number of children: Not on file  . Years of education: Not on file  . Highest education level: Not on file  Occupational History  . Occupation: shipping & receiving  Tobacco Use  . Smoking status: Never Smoker  . Smokeless tobacco: Never Used  Substance and Sexual Activity  . Alcohol use: Not Currently  . Drug use: Not Currently  . Sexual activity: Yes  Other Topics Concern  . Not on file  Social History Narrative  . Not on file   Social Determinants of Health   Financial Resource Strain: Not on file  Food Insecurity: Not on file  Transportation Needs: Not on file  Physical Activity: Not on file  Stress: Not on file  Social Connections: Not on file    Family History  Problem Relation Age of Onset  . Diabetes Mother   . Diabetes Father   . Hypertension Father     Outpatient Encounter Medications as of 03/27/2020  Medication Sig  . blood glucose meter kit and supplies Check blood glucose fasting, before lunch, before dinner and at bedtime  . glipiZIDE (GLUCOTROL XL) 5 MG 24 hr tablet Take 1 tablet by mouth once daily with breakfast  . ibuprofen (ADVIL) 600 MG tablet Take 1 tablet (600 mg total) by mouth 2 (two) times daily as needed for moderate pain.  Marland Kitchen insulin degludec (TRESIBA FLEXTOUCH) 100 UNIT/ML FlexTouch Pen Inject 60 Units into the skin at bedtime.  .  Insulin Pen Needle (B-D ULTRAFINE III SHORT PEN) 31G X 8 MM MISC 1 each by Does not apply route 4 (four) times daily.  . metFORMIN (GLUCOPHAGE-XR) 500 MG 24 hr tablet Take 1 tablet by mouth once daily with breakfast  . ONETOUCH ULTRA test strip 1 each by Other route 2 (two) times daily. DX E11.8  . valACYclovir (VALTREX) 500 MG tablet Take 1 tablet (500 mg total) by mouth daily.   No facility-administered encounter medications on file as of 03/27/2020.    ALLERGIES: No Known Allergies  VACCINATION STATUS: Immunization History  Administered Date(s) Administered  . Influenza,inj,Quad PF,6+ Mos 12/02/2019  . PFIZER(Purple Top)SARS-COV-2 Vaccination 05/03/2019, 05/28/2019, 12/17/2019    Diabetes She presents for her follow-up diabetic visit. She has type 2 diabetes mellitus. Onset time: She was diagnosed at approximate age of. Her disease course has been worsening. Hypoglycemia symptoms include nervousness/anxiousness, sweats and tremors. Pertinent negatives for hypoglycemia include no confusion, headaches, pallor or seizures. Associated symptoms include fatigue, foot paresthesias and polydipsia. Pertinent negatives for diabetes include no polyphagia  and no polyuria. There are no hypoglycemic complications. Symptoms are stable. There are no diabetic complications. Risk factors for coronary artery disease include diabetes mellitus, family history and sedentary lifestyle. Current diabetic treatment includes oral agent (dual therapy) and insulin injections. She is compliant with treatment most of the time. Her weight is fluctuating minimally. She is following a generally unhealthy diet. When asked about meal planning, she reported none. She has not had a previous visit with a dietitian. She participates in exercise intermittently. Her home blood glucose trend is fluctuating minimally. Her breakfast blood glucose range is generally 70-90 mg/dl. (She presents today with her meter, no logs, showing  inconsistent monitoring pattern.  Analysis of her meter shows 7-day average of 82 with only 2 readings, 14-day average of 168 with 6 readings, and 30-day average of 173 with 16 readings.  Her POCT A1c today is 9.3%, increasing from previous visit of 7.2%.  She reports her diet has been lacking since the holidays and she is struggling to get back on track.  She also changed jobs since last visit and is not as active as she once was.  She does have tightening fasting glucose recently, likely associated to inadequate carb consumption at dinner. ) An ACE inhibitor/angiotensin II receptor blocker is not being taken. She does not see a podiatrist.Eye exam is not current.  Hypertension This is a chronic problem. The current episode started more than 1 month ago. The problem has been gradually worsening since onset. The problem is uncontrolled. Associated symptoms include sweats. Pertinent negatives include no headaches. There are no associated agents to hypertension. Risk factors for coronary artery disease include diabetes mellitus, dyslipidemia, sedentary lifestyle and stress. Past treatments include nothing. Compliance problems include diet and exercise.     Review of systems  Constitutional: + Minimally fluctuating body weight,  current Body mass index is 29.63 kg/m. , + fatigue, no subjective hyperthermia, no subjective hypothermia Eyes: no blurry vision, no xerophthalmia ENT: no sore throat, no nodules palpated in throat, no dysphagia/odynophagia, no hoarseness Cardiovascular: no chest pain, no shortness of breath, no palpitations, occasional leg swelling Respiratory: no cough, no shortness of breath Gastrointestinal: no nausea/vomiting/diarrhea Musculoskeletal: no muscle/joint aches Skin: no rashes, no hyperemia Neurological: no tremors, no dizziness, + numbness/tingling in bilateral feet Psychiatric: no depression, no anxiety   Objective:    BP (!) 143/92 (BP Location: Right Arm, Patient  Position: Sitting)   Pulse 90   Ht _0  (1.676 m)   Wt 183 lb 9.6 oz (83.3 kg)   BMI 29.63 kg/m   Wt Readings from Last 3 Encounters:  03/27/20 183 lb 9.6 oz (83.3 kg)  03/03/20 184 lb (83.5 kg)  11/14/19 179 lb 12.8 oz (81.6 kg)     BP Readings from Last 3 Encounters:  03/27/20 (!) 143/92  03/03/20 132/80  12/23/19 (!) 138/93      Physical Exam- Limited  Constitutional:  Body mass index is 29.63 kg/m. , not in acute distress, normal state of mind Eyes:  EOMI, no exophthalmos Neck: Supple Cardiovascular: RRR, no murmers, rubs, or gallops, no edema Respiratory: Adequate breathing efforts, no crackles, rales, rhonchi, or wheezing Musculoskeletal: no gross deformities, strength intact in all four extremities, no gross restriction of joint movements Skin:  no rashes, no hyperemia Neurological: no tremor with outstretched hands   CMP ( most recent) CMP     Component Value Date/Time   NA 138 11/13/2019 1017   NA 133 (A) 11/13/2018 0000   K 4.4  11/13/2019 1017   CL 106 11/13/2019 1017   CO2 25 11/13/2019 1017   GLUCOSE 89 11/13/2019 1017   BUN 13 11/13/2019 1017   BUN 16 11/13/2018 0000   CREATININE 0.83 11/13/2019 1017   CALCIUM 9.3 11/13/2019 1017   PROT 7.1 11/13/2019 1017   ALBUMIN 4.4 11/13/2018 0000   AST 7 (L) 11/13/2019 1017   ALT 11 11/13/2019 1017   ALKPHOS 50 05/11/2009 1551   BILITOT 0.4 11/13/2019 1017   GFRNONAA 85 11/13/2019 1017   GFRAA 99 11/13/2019 1017     Diabetic Labs (most recent): Lab Results  Component Value Date   HGBA1C 9.3 (A) 03/27/2020   HGBA1C 7.2 (A) 11/14/2019   HGBA1C 10.2 (A) 06/10/2019     Assessment & Plan:   1) Uncontrolled type 2 diabetes - Brandi Oneill has currently uncontrolled symptomatic type 2 DM since  44 years of age.  She presents today with her meter, no logs, showing inconsistent monitoring pattern.  Analysis of her meter shows 7-day average of 82 with only 2 readings, 14-day average of 168 with 6  readings, and 30-day average of 173 with 16 readings.  Her POCT A1c today is 9.3%, increasing from previous visit of 7.2%.  She reports her diet has been lacking since the holidays and she is struggling to get back on track.  She also changed jobs since last visit and is not as active as she once was.  She does have tightening fasting glucose recently, likely associated to inadequate carb consumption at dinner.  - Recent labs reviewed.  POCT UM today is 10.  - I had a long discussion with her about the progressive nature of diabetes and the pathology behind its complications.  -She does not report gross complications, however she remains at a high risk for more acute and chronic complications which include CAD, CVA, CKD, retinopathy, and neuropathy. These are all discussed in detail with her.  - Nutritional counseling repeated at each appointment due to patients tendency to fall back in to old habits.  - The patient admits there is a room for improvement in their diet and drink choices. -  Suggestion is made for the patient to avoid simple carbohydrates from their diet including Cakes, Sweet Desserts / Pastries, Ice Cream, Soda (diet and regular), Sweet Tea, Candies, Chips, Cookies, Sweet Pastries,  Store Bought Juices, Alcohol in Excess of  1-2 drinks a day, Artificial Sweeteners, Coffee Creamer, and "Sugar-free" Products. This will help patient to have stable blood glucose profile and potentially avoid unintended weight gain.   - I encouraged the patient to switch to  unprocessed or minimally processed complex starch and increased protein intake (animal or plant source), fruits, and vegetables.   - Patient is advised to stick to a routine mealtimes to eat 3 meals  a day and avoid unnecessary snacks ( to snack only to correct hypoglycemia).  - I have approached her with the following individualized plan to manage  her diabetes and patient agrees:   -She is advised to re-engage in proper  monitoring and management of her diabetes.  She is advised to continue Tresiba 60 units SQ nightly, continue Metformin 500 mg ER daily with breakfast and Glipizide 5 mg XL daily with breakfast.  -She is encouraged to start monitoring blood glucose at least twice daily, before breakfast and before bed, and call the clinic if she has readings less than 70 or greater than 200 for 3 tests in a row.  -  Specific targets for  A1c;  LDL, HDL,  and Triglycerides were discussed with the patient.  2) Blood Pressure /Hypertension:  Her blood pressure is not controlled to target.  She is not currently on any antihypertensive medications at this time.  She may benefit from addition of low dose ACE/ARB on subsequent visits for renal protection from diabetes if her BP remains elevated on 3 separate visits.  3) Lipids/Hyperlipidemia:  Her most recent lipid panel from 11/13/19 shows controlled LDL of 77.  She is not on any cholesterol lowering medications at this time.  She may benefit from addition of statin medication in the future.  4)  Weight/Diet:  Her Body mass index is 29.63 kg/m. -  she is a candidate for some weight loss.  I discussed with her the fact that loss of 5 - 10% of her  current body weight will have the most impact on her diabetes management.  Exercise, and detailed carbohydrates information provided  -  detailed on discharge instructions.  5) Chronic Care/Health Maintenance: -she is not on ACEI/ARB and Statin medications and is encouraged to initiate and continue to follow up with Ophthalmology, Dentist,  Podiatrist at least yearly or according to recommendations, and advised to  stay away from smoking. I have recommended yearly flu vaccine and pneumonia vaccine at least every 5 years; moderate intensity exercise for up to 150 minutes weekly; and  sleep for at least 7 hours a day.  - she is  advised to maintain close follow up with Lindell Spar, MD for primary care needs, as well as her  other providers for optimal and coordinated care.  - Time spent on this patient care encounter:  40 min, of which > 50% was spent in  counseling and the rest reviewing her blood glucose logs , discussing her hypoglycemia and hyperglycemia episodes, reviewing her current and  previous labs / studies  ( including abstraction from other facilities) and medications  doses and developing a  long term treatment plan and documenting her care.   Please refer to Patient Instructions for Blood Glucose Monitoring and Insulin/Medications Dosing Guide"  in media tab for additional information. Please  also refer to " Patient Self Inventory" in the Media  tab for reviewed elements of pertinent patient history.  Brandi Oneill participated in the discussions, expressed understanding, and voiced agreement with the above plans.  All questions were answered to her satisfaction. she is encouraged to contact clinic should she have any questions or concerns prior to her return visit.   Follow up plan: - Return in about 3 months (around 06/24/2020) for Diabetes follow up with A1c in office, Previsit labs, Bring glucometer and logs, ABI next visit.  Brandi Oneill, [facility], [facility] [address] [place], [place] [address] Phone: [phone] Fax: [phone]  03/27/2020, 10:42 AM

## 2020-03-27 NOTE — Patient Instructions (Signed)

## 2020-04-01 ENCOUNTER — Ambulatory Visit (HOSPITAL_COMMUNITY): Payer: 59 | Admitting: Occupational Therapy

## 2020-04-01 ENCOUNTER — Telehealth (HOSPITAL_COMMUNITY): Payer: Self-pay | Admitting: Occupational Therapy

## 2020-04-01 NOTE — Telephone Encounter (Signed)
pt cancelled due to having car trouble.

## 2020-04-07 ENCOUNTER — Encounter: Payer: 59 | Admitting: Adult Health

## 2020-04-08 ENCOUNTER — Ambulatory Visit (HOSPITAL_COMMUNITY): Payer: 59 | Attending: Physician Assistant | Admitting: Occupational Therapy

## 2020-04-08 ENCOUNTER — Telehealth (HOSPITAL_COMMUNITY): Payer: Self-pay | Admitting: Occupational Therapy

## 2020-04-08 DIAGNOSIS — R29898 Other symptoms and signs involving the musculoskeletal system: Secondary | ICD-10-CM | POA: Insufficient documentation

## 2020-04-08 DIAGNOSIS — M25611 Stiffness of right shoulder, not elsewhere classified: Secondary | ICD-10-CM | POA: Insufficient documentation

## 2020-04-08 DIAGNOSIS — M25511 Pain in right shoulder: Secondary | ICD-10-CM | POA: Insufficient documentation

## 2020-04-08 NOTE — Telephone Encounter (Signed)
Called pt regarding no-show for OT appt 04/08/20 at 7:30am. No answer, left voicemail requesting pt return call to reschedule. Reminded of next scheduled appt on 04/15/20 and requested pt call to cancel if unable to attend.   Brandi Oneill, OTR/L  [phone] 04/08/20

## 2020-04-14 ENCOUNTER — Other Ambulatory Visit: Payer: Self-pay

## 2020-04-14 ENCOUNTER — Encounter: Payer: Self-pay | Admitting: Internal Medicine

## 2020-04-14 ENCOUNTER — Ambulatory Visit (INDEPENDENT_AMBULATORY_CARE_PROVIDER_SITE_OTHER): Payer: 59 | Admitting: Internal Medicine

## 2020-04-14 VITALS — BP 139/82 | HR 88 | Resp 18 | Ht 66.0 in | Wt 182.8 lb

## 2020-04-14 DIAGNOSIS — E1165 Type 2 diabetes mellitus with hyperglycemia: Secondary | ICD-10-CM

## 2020-04-14 DIAGNOSIS — E118 Type 2 diabetes mellitus with unspecified complications: Secondary | ICD-10-CM

## 2020-04-14 DIAGNOSIS — M25511 Pain in right shoulder: Secondary | ICD-10-CM

## 2020-04-14 DIAGNOSIS — B351 Tinea unguium: Secondary | ICD-10-CM

## 2020-04-14 DIAGNOSIS — Z794 Long term (current) use of insulin: Secondary | ICD-10-CM

## 2020-04-14 DIAGNOSIS — E1142 Type 2 diabetes mellitus with diabetic polyneuropathy: Secondary | ICD-10-CM | POA: Insufficient documentation

## 2020-04-14 DIAGNOSIS — G8929 Other chronic pain: Secondary | ICD-10-CM

## 2020-04-14 DIAGNOSIS — IMO0002 Reserved for concepts with insufficient information to code with codable children: Secondary | ICD-10-CM

## 2020-04-14 MED ORDER — GABAPENTIN 100 MG PO CAPS: 100.0000 mg | ORAL_CAPSULE | Freq: Two times a day (BID) | ORAL | 0 refills | Status: DC

## 2020-04-14 MED ORDER — TERBINAFINE HCL 250 MG PO TABS: 250.0000 mg | ORAL_TABLET | Freq: Every day | ORAL | 0 refills | Status: DC

## 2020-04-14 NOTE — Assessment & Plan Note (Signed)
X-ray reviewed Now better with physical therapy

## 2020-04-14 NOTE — Assessment & Plan Note (Signed)
Started oral terbinafine

## 2020-04-14 NOTE — Patient Instructions (Signed)
Please start taking Terbinafine for toenail infection.  Please start taking Gabapentin for neuropathic pain.  Please continue to follow low carbohydrate diet.Please continue to take medications regularly. Continue to check blood glucose regularly and bring the log in the next visit.

## 2020-04-14 NOTE — Progress Notes (Signed)
Established Patient Office Visit  Subjective:  Patient ID: Brandi Oneill, female    DOB: 1975/08/13  Age: 45 y.o. MRN: [id number]  CC:  Chief Complaint  Patient presents with  . Follow-up    6 week follow up right shoulder pain is better than it was    HPI Brandi Oneill is a 46 year old female with PMH of type 2 DM who presents for follow up of right shoulder pain.  Her shoulder pain is better with physical therapy now. She is able to move her shoulder with minimal pain.  She mentions having toenail infection. She tried topical Lamisil with no benefit. She also mentions having numbness and tingling in the feet. She was on Gabapentin in the past.  She recently went to her Endocrinologist for DM. Her HbA1C increased to 9.3 recently from 7.2. She attributes it to her diet change during the holidays. She has been less active since changing job. She denies any polyuria or polydipsia.  Past Medical History:  Diagnosis Date  . Allergy   . Diabetes mellitus without complication ([place])     History reviewed. No pertinent surgical history.  Family History  Problem Relation Age of Onset  . Diabetes Mother   . Diabetes Father   . Hypertension Father     Social History   Socioeconomic History  . Marital status: Single    Spouse name: Not on file  . Number of children: Not on file  . Years of education: Not on file  . Highest education level: Not on file  Occupational History  . Occupation: shipping & receiving  Tobacco Use  . Smoking status: Never Smoker  . Smokeless tobacco: Never Used  Substance and Sexual Activity  . Alcohol use: Not Currently  . Drug use: Not Currently  . Sexual activity: Yes  Other Topics Concern  . Not on file  Social History Narrative  . Not on file   Social Determinants of Health   Financial Resource Strain: Not on file  Food Insecurity: Not on file  Transportation Needs: Not on file  Physical Activity: Not on file  Stress: Not on file   Social Connections: Not on file  Intimate Partner Violence: Not on file    Outpatient Medications Prior to Visit  Medication Sig Dispense Refill  . blood glucose meter kit and supplies Check blood glucose fasting, before lunch, before dinner and at bedtime 1 each 5  . glipiZIDE (GLUCOTROL XL) 5 MG 24 hr tablet Take 1 tablet by mouth once daily with breakfast 30 tablet 3  . ibuprofen (ADVIL) 600 MG tablet Take 1 tablet (600 mg total) by mouth 2 (two) times daily as needed for moderate pain. 30 tablet 0  . insulin degludec (TRESIBA FLEXTOUCH) 100 UNIT/ML FlexTouch Pen Inject 60 Units into the skin at bedtime. 30 mL 3  . Insulin Pen Needle (B-D ULTRAFINE III SHORT PEN) 31G X 8 MM MISC 1 each by Does not apply route 4 (four) times daily. 100 each 3  . metFORMIN (GLUCOPHAGE-XR) 500 MG 24 hr tablet Take 1 tablet by mouth once daily with breakfast 30 tablet 3  . ONETOUCH ULTRA test strip 1 each by Other route 2 (two) times daily. DX E11.8 100 each 3  . valACYclovir (VALTREX) 500 MG tablet Take 1 tablet (500 mg total) by mouth daily. 30 tablet 11  . meloxicam (MOBIC) 15 MG tablet Take 15 mg by mouth daily. (Patient not taking: Reported on 04/14/2020)     No  facility-administered medications prior to visit.    No Known Allergies  ROS Review of Systems  Constitutional: Negative for chills and fever.  HENT: Negative for congestion, sinus pressure, sinus pain and sore throat.   Eyes: Negative for pain and discharge.  Respiratory: Negative for cough and shortness of breath.   Cardiovascular: Negative for chest pain and palpitations.  Gastrointestinal: Negative for abdominal pain, constipation, diarrhea, nausea and vomiting.  Endocrine: Negative for polydipsia and polyuria.  Genitourinary: Positive for menstrual problem. Negative for dysuria, hematuria and pelvic pain.  Musculoskeletal: Positive for arthralgias (right shoulder). Negative for neck pain and neck stiffness.  Skin: Negative for rash.   Neurological: Negative for dizziness and weakness.  Psychiatric/Behavioral: Negative for agitation and behavioral problems.      Objective:    Physical Exam Vitals reviewed.  Constitutional:      General: She is not in acute distress.    Appearance: She is not diaphoretic.  HENT:     Head: Normocephalic and atraumatic.     Nose: Nose normal.     Mouth/Throat:     Mouth: Mucous membranes are moist.  Eyes:     General: No scleral icterus.    Extraocular Movements: Extraocular movements intact.     Pupils: Pupils are equal, round, and reactive to light.  Cardiovascular:     Rate and Rhythm: Normal rate and regular rhythm.     Pulses: Normal pulses.     Heart sounds: Normal heart sounds. No murmur heard.   Pulmonary:     Breath sounds: Normal breath sounds. No wheezing or rales.  Musculoskeletal:     Right shoulder: No bony tenderness. Decreased range of motion (Due to pain).     Left shoulder: Normal. No bony tenderness. Normal range of motion.     Cervical back: Neck supple. No tenderness.     Right lower leg: No edema.     Left lower leg: No edema.  Skin:    General: Skin is warm.     Findings: No rash.  Neurological:     General: No focal deficit present.     Mental Status: She is alert and oriented to person, place, and time.     Sensory: No sensory deficit.     Motor: No weakness.  Psychiatric:        Mood and Affect: Mood normal.        Behavior: Behavior normal.     BP 139/82 (BP Location: Right Arm, Patient Position: Sitting, Cuff Size: Normal)   Pulse 88   Resp 18   Ht 5' 6"  (1.676 m)   Wt 182 lb 12.8 oz (82.9 kg)   SpO2 98%   BMI 29.50 kg/m  Wt Readings from Last 3 Encounters:  04/14/20 182 lb 12.8 oz (82.9 kg)  03/27/20 183 lb 9.6 oz (83.3 kg)  03/03/20 184 lb (83.5 kg)     Health Maintenance Due  Topic Date Due  . Hepatitis C Screening  Never done  . PNEUMOCOCCAL POLYSACCHARIDE VACCINE AGE 6-64 HIGH RISK  Never done  . OPHTHALMOLOGY EXAM   Never done  . HIV Screening  Never done  . TETANUS/TDAP  Never done  . PAP SMEAR-Modifier  Never done  . COLONOSCOPY (Pts 45-72yr Insurance coverage will need to be confirmed)  Never done    There are no preventive care reminders to display for this patient.  Lab Results  Component Value Date   TSH 2.18 11/13/2019   Lab Results  Component Value  Date   WBC 6.8 11/13/2018   HGB 13.8 11/13/2018   HCT 43 11/13/2018   MCV 84.1 05/11/2009   PLT 282 05/11/2009   Lab Results  Component Value Date   NA 138 11/13/2019   K 4.4 11/13/2019   CO2 25 11/13/2019   GLUCOSE 89 11/13/2019   BUN 13 11/13/2019   CREATININE 0.83 11/13/2019   BILITOT 0.4 11/13/2019   ALKPHOS 50 05/11/2009   AST 7 (L) 11/13/2019   ALT 11 11/13/2019   PROT 7.1 11/13/2019   ALBUMIN 4.4 11/13/2018   CALCIUM 9.3 11/13/2019   Lab Results  Component Value Date   CHOL 129 11/13/2019   Lab Results  Component Value Date   HDL 37 (L) 11/13/2019   Lab Results  Component Value Date   LDLCALC 77 11/13/2019   Lab Results  Component Value Date   TRIG 66 11/13/2019   Lab Results  Component Value Date   CHOLHDL 3.5 11/13/2019   Lab Results  Component Value Date   HGBA1C 9.3 (A) 03/27/2020      Assessment & Plan:   Problem List Items Addressed This Visit      Endocrine   Uncontrolled type 2 diabetes mellitus with complication, with long-term current use of insulin (HCC)    HbA1C: 9.3 On Tresiba 60 U qHS, Metformin 500 mg QD and Glipizide 5 mg QD Advised to follow diabetic diet F/u CMP and lipid panel Diabetic eye exam: Advised to follow up with Ophthalmology for diabetic eye exam       Diabetic polyneuropathy associated with type 2 diabetes mellitus (HCC)    Started Gabapentin 100 mg BID      Relevant Medications   gabapentin (NEURONTIN) 100 MG capsule     Musculoskeletal and Integument   Onychomycosis - Primary    Started oral terbinafine      Relevant Medications   terbinafine  (LAMISIL) 250 MG tablet     Other   Chronic right shoulder pain    X-ray reviewed Now better with physical therapy      Relevant Medications   meloxicam (MOBIC) 15 MG tablet   gabapentin (NEURONTIN) 100 MG capsule      Meds ordered this encounter  Medications  . terbinafine (LAMISIL) 250 MG tablet    Sig: Take 1 tablet (250 mg total) by mouth daily.    Dispense:  84 tablet    Refill:  0  . gabapentin (NEURONTIN) 100 MG capsule    Sig: Take 1 capsule (100 mg total) by mouth 2 (two) times daily.    Dispense:  60 capsule    Refill:  0    Follow-up: Return in about 3 months (around 07/15/2020) for Annual physical.    Lindell Spar, MD

## 2020-04-14 NOTE — Assessment & Plan Note (Signed)
HbA1C: 9.3 On Tresiba 60 U qHS, Metformin 500 mg QD and Glipizide 5 mg QD Advised to follow diabetic diet F/u CMP and lipid panel Diabetic eye exam: Advised to follow up with Ophthalmology for diabetic eye exam

## 2020-04-14 NOTE — Assessment & Plan Note (Signed)
Started Gabapentin 100 mg BID

## 2020-04-15 ENCOUNTER — Encounter (HOSPITAL_COMMUNITY): Payer: Self-pay | Admitting: Occupational Therapy

## 2020-04-15 ENCOUNTER — Ambulatory Visit (HOSPITAL_COMMUNITY): Payer: 59 | Admitting: Occupational Therapy

## 2020-04-15 DIAGNOSIS — M25611 Stiffness of right shoulder, not elsewhere classified: Secondary | ICD-10-CM

## 2020-04-15 DIAGNOSIS — R29898 Other symptoms and signs involving the musculoskeletal system: Secondary | ICD-10-CM

## 2020-04-15 DIAGNOSIS — M25511 Pain in right shoulder: Secondary | ICD-10-CM | POA: Diagnosis present

## 2020-04-15 NOTE — Patient Instructions (Signed)
  1) Flexion Wall Stretch    Face wall, place affected handon wall in front of you. Slide hand up the wall  and lean body in towards the wall. Hold for 10 seconds. Repeat 3-5 times. 1-2 times/day.     2) Towel Stretch with Internal Rotation   Or     Gently pull up (or to the side) your affected arm  behind your back with the assist of a towel. Hold 10 seconds, repeat 3-5 times. 1-2 times/day.             3) Corner Stretch    Stand at a corner of a wall, place your arms on the walls with elbows bent. Lean into the corner until a stretch is felt along the front of your chest and/or shoulders. Hold for 10 seconds. Repeat 3-5X, 1-2 times/day.    4) Posterior Capsule Stretch    Bring the involved arm across chest. Grasp elbow and pull toward chest until you feel a stretch in the back of the upper arm and shoulder. Hold 10 seconds. Repeat 3-5X. Complete 1-2 times/day.    5) Scapular Retraction    Tuck chin back as you pinch shoulder blades together.  Hold 5 seconds. Repeat 3-5X. Complete 1-2 times/day.    6) External Rotation Stretch:     Place your affected hand on the wall with the elbow bent and gently turn your body the opposite direction until a stretch is felt. Hold 10 seconds, repeat 3-5X. Complete 1-2 times/day.     

## 2020-04-15 NOTE — Therapy (Signed)
[place], [place], [address] Phone: [phone]   Fax:  [phone]  Occupational Therapy Treatment  Patient Details  Name: Brandi Oneill MRN: [id number] Date of Birth: Jun 08, 1975 Referring Provider (OT): Matthew Saras, [place]   Encounter Date: 04/15/2020   OT End of Session - 04/15/20 0817    Visit Number 3    Number of Visits 16    Date for OT Re-Evaluation 05/17/20    Authorization Type [employer]    Authorization Time Period 30 visit limit    Authorization - Visit Number 3    Authorization - Number of Visits 30    OT Start Time 0732    OT Stop Time 0812    OT Time Calculation (min) 40 min    Activity Tolerance Patient tolerated treatment well    Behavior During Therapy [facility] for tasks assessed/performed           Past Medical History:  Diagnosis Date  . Allergy   . Diabetes mellitus without complication ([place])     History reviewed. No pertinent surgical history.  There were no vitals filed for this visit.   Subjective Assessment - 04/15/20 0735    Subjective  S: The only time it hurts is at night.    Currently in Pain? No/denies              [facility] OT Assessment - 04/15/20 0732      Assessment   Medical Diagnosis right adhesive capsulitis      Precautions   Precautions None      AROM   Overall AROM Comments Assessed seated, er/IR adducted    AROM Assessment Site Shoulder    Right/Left Shoulder Right    Right Shoulder Flexion 126 Degrees   85 previous   Right Shoulder ABduction 126 Degrees   60 previous   Right Shoulder Internal Rotation 90 Degrees   same as previous   Right Shoulder External Rotation 55 Degrees   27 previous     PROM   Overall PROM Comments Assessed supine, er/IR adducted    PROM Assessment Site Shoulder    Right/Left Shoulder Right    Right Shoulder Flexion 122 Degrees   95 previous   Right Shoulder ABduction 119 Degrees   66 previous   Right Shoulder  Internal Rotation 90 Degrees   same as previous   Right Shoulder External Rotation 25 Degrees   18 previous     Strength   Overall Strength Comments Assessed seated, er/IR adducted    Strength Assessment Site Shoulder    Right/Left Shoulder Right    Right Shoulder Flexion 5/5   3-/5 previous   Right Shoulder ABduction 4+/5   3-/5 previous   Right Shoulder Internal Rotation 5/5   3-/5 previous   Right Shoulder External Rotation 4+/5   3-/5 previous                   OT Treatments/Exercises (OP) - 04/15/20 0735      Exercises   Exercises Shoulder      Shoulder Exercises: Supine   Protraction PROM;5 reps;AROM;10 reps    Horizontal ABduction PROM;5 reps;AROM;10 reps    External Rotation PROM;5 reps;AAROM;10 reps    Internal Rotation PROM;5 reps;AAROM;10 reps    Flexion PROM;5 reps;AAROM;10 reps    ABduction PROM;5 reps      Shoulder Exercises: Stretch   Cross Chest Stretch 3 reps;10 seconds    Internal Rotation Stretch  3 reps   10" holds, horizontal towel   External Rotation Stretch 3 reps;10 seconds    Wall Stretch - Flexion 3 reps;10 seconds    Other Shoulder Stretches doorway stretch, 3x10"      Manual Therapy   Manual Therapy Myofascial release    Manual therapy comments Manual therapy completed prior to exercises.    Myofascial Release Myofascial release and manual stretching completing to right upper arm, trapezius, and scapularis region to decrease fascial restrictions and increase joint mobility in a pain free zone.                  OT Education - 04/15/20 0759    Education Details shoulder stretches    Person(s) Educated Patient    Methods Explanation;Demonstration;Handout    Comprehension Verbalized understanding;Returned demonstration            OT Short Term Goals - 04/15/20 0757      OT SHORT TERM GOAL #1   Title Pt will be provided with and educated on HEP to improve mobility required for RUE use during ADLs.    Time 4    Period  Weeks    Status On-going    Target Date 04/17/20      OT SHORT TERM GOAL #2   Title Pt will increase RUE P/ROM to [facility] to improve ability to perform dressing tasks without compensatory techniques.    Time 4    Period Weeks    Status On-going      OT SHORT TERM GOAL #3   Title Pt will increase RUE strength to 3+/5 to improve ability to reach items at chest to head height in cabinets.    Time 4    Period Weeks    Status Achieved             OT Long Term Goals - 04/15/20 0757      OT LONG TERM GOAL #1   Title Pt will decrease RUE pain to 3/10 or less to improve ability to sleep for 4+ consecutive hours without waking due to pain.    Time 8    Period Weeks    Status Achieved      OT LONG TERM GOAL #2   Title Pt will decrease RUE fascial restrictions to minimal amounts or less to improve mobility required for functional reaching tasks.    Time 8    Period Weeks    Status On-going      OT LONG TERM GOAL #3   Title Pt will increase RUE A/ROM to [facility] to improve ability to reach overhead and behind back during dressing and bathing tasks.    Time 8    Period Weeks    Status On-going      OT LONG TERM GOAL #4   Title Pt will increase RUE strength to 4+/5 to improve ability to perform cleaning tasks at work and home.    Time 8    Period Weeks    Status Achieved                 Plan - 04/15/20 0756    Clinical Impression Statement A: Today was pt's first session since 03/24/20. Mini-reassessment completed this session, pt has made progress and reports significant decrease in pain, and improved ability to reach above shoulder level and behind back. Pt has met 1 STG and 2 LTGs. Pt continues to demonstrate ROM deficits and increased fascial restrictions limiting overhead reaching, also with decreased activity tolerance.  Progressed to some A/ROM today, also adding shoulder stretches in standing and updating HEP. Verbal cuing for form and technique.    Body Structure / Function /  Physical Skills ADL;Endurance;UE functional use;Fascial restriction;Pain;ROM;IADL;Strength;Mobility    Plan P: Follow up on HEP, continue progressing to A/ROM, therapy ball stretch up wall    OT Home Exercise Plan eval: table slides, heat/massage; 3/16: shoulder stretches    Consulted and Agree with Plan of Care Patient           Patient will benefit from skilled therapeutic intervention in order to improve the following deficits and impairments:   Body Structure / Function / Physical Skills: ADL,Endurance,UE functional use,Fascial restriction,Pain,ROM,IADL,Strength,Mobility       Visit Diagnosis: Stiffness of right shoulder, not elsewhere classified  Other symptoms and signs involving the musculoskeletal system  Acute pain of right shoulder    Problem List Patient Active Problem List   Diagnosis Date Noted  . Diabetic polyneuropathy associated with type 2 diabetes mellitus ([place]) 04/14/2020  . Chronic right shoulder pain 03/03/2020  . Perimenopause 03/03/2020  . Onychomycosis 03/03/2020  . Essential hypertension, benign 02/04/2019  . Chronic midline low back pain without sciatica 01/13/2019  . Uncontrolled type 2 diabetes mellitus with complication, with long-term current use of insulin ([place]) 12/13/2018  . Thyroid disease 12/13/2018   Guadelupe Sabin, OTR/L  [phone] 04/15/2020, 8:19 AM  [facility] [address] [place], [place], [address] Phone: [phone]   Fax:  [phone]  Name: Brandi Oneill MRN: [id number] Date of Birth: [birth year]

## 2020-04-20 ENCOUNTER — Other Ambulatory Visit: Payer: Self-pay

## 2020-04-20 ENCOUNTER — Ambulatory Visit (INDEPENDENT_AMBULATORY_CARE_PROVIDER_SITE_OTHER): Payer: 59 | Admitting: Adult Health

## 2020-04-20 ENCOUNTER — Encounter: Payer: Self-pay | Admitting: Adult Health

## 2020-04-20 VITALS — BP 148/96 | HR 94 | Ht 66.0 in | Wt 183.0 lb

## 2020-04-20 DIAGNOSIS — R232 Flushing: Secondary | ICD-10-CM

## 2020-04-20 DIAGNOSIS — R4589 Other symptoms and signs involving emotional state: Secondary | ICD-10-CM

## 2020-04-20 DIAGNOSIS — N926 Irregular menstruation, unspecified: Secondary | ICD-10-CM

## 2020-04-20 DIAGNOSIS — N951 Menopausal and female climacteric states: Secondary | ICD-10-CM | POA: Diagnosis not present

## 2020-04-20 DIAGNOSIS — G479 Sleep disorder, unspecified: Secondary | ICD-10-CM

## 2020-04-20 DIAGNOSIS — Z1231 Encounter for screening mammogram for malignant neoplasm of breast: Secondary | ICD-10-CM

## 2020-04-20 NOTE — Patient Instructions (Signed)
Menopause and Hormone Replacement Therapy Menopause is a normal time of life when menstrual periods stop completely and the ovaries stop producing the female hormones estrogen and progesterone. Low levels of these hormones can affect your health and cause symptoms. Hormone replacement therapy (HRT) can relieve some of those symptoms. HRT is the use of artificial (synthetic) hormones to replace hormones that your body has stopped producing because you have reached menopause. Types of HRT HRT may consist of the synthetic hormones estrogen and progestin, or it may consist of estrogen-only therapy. You and your health care provider will decide which form of HRT is best for you. If you choose to be on HRT and you have a uterus, estrogen and progestin are usually prescribed. Estrogen-only therapy is used for women who do not have a uterus. Possible options for taking HRT include:  Pills.  Patches.  Gels.  Sprays.  Vaginal cream.  Vaginal rings.  Vaginal inserts. The amount of hormones that you take and how long you take them varies according to your health. It is important to:  Begin HRT with the lowest possible dosage.  Stop HRT as soon as your health care provider tells you to stop.  Work with your health care provider so that you feel informed and comfortable with your decisions.   Tell a health care provider about:  Any allergies you have.  Whether you have had blood clots or know of any risk factors you may have for blood clots.  Whether you or family members have had cancer, especially cancer of the breasts, ovaries, or uterus.  Any surgeries you have had.  All medicines you are taking, including vitamins, herbs, eye drops, creams, and over-the-counter medicines.  Whether you are pregnant or may be pregnant.  Any medical conditions you have. What are the benefits? HRT can reduce the frequency and severity of menopausal symptoms. Benefits of HRT vary according to the kind  of symptoms that you have, how severe they are, and your overall health. HRT may help to improve the following symptoms of menopause:  Hot flashes and night sweats. These are sudden feelings of heat that spread over the face and body. The skin may turn red, like a blush. Night sweats are hot flashes that happen while you are sleeping or trying to sleep.  Bone loss (osteoporosis). The body loses calcium more quickly after menopause, causing the bones to become weaker. This can increase the risk for bone breaks (fractures).  Vaginal dryness. The lining of the vagina can become thin and dry, which can cause pain during sex or cause infection, burning, or itching.  Urinary tract infections.  Urinary incontinence. This is the inability to control when you urinate.  Irritability.  Short-term memory problems. What are the risks? Risks of HRT vary depending on your individual health and medical history. Risks of HRT also depend on whether you receive both estrogen and progestin or you receive estrogen only. HRT may increase the risk of:  Spotting. This is when a small amount of blood leaks from the vagina unexpectedly.  Endometrial cancer. This cancer is in the lining of the uterus (endometrium).  Breast cancer.  Increased density of breast tissue. This can make it harder to find breast cancer on a breast X-ray (mammogram).  Stroke.  Heart disease.  Blood clots.  Gallbladder disease or liver disease. Risks of HRT can increase if you have any of the following conditions:  Endometrial cancer.  Liver disease.  Heart disease.  Breast cancer.    History of blood clots.  History of stroke. Follow these instructions at home: Pap tests  Have Pap tests done as often as told by your health care provider. A Pap test is sometimes called a Pap smear. It is a screening test that is used to check for signs of cancer of the cervix and vagina. A Pap test can also identify the presence of  infection or precancerous changes. Pap tests may be done: ? Every 3 years, starting at age 21. ? Every 5 years, starting after age 30, in combination with testing for human papillomavirus (HPV). ? More often or less often depending on other medical conditions you have, your age, and other risk factors.  It is up to you to get the results of your Pap test. Ask your health care provider, or the department that is doing the test, when your results will be ready. General instructions  Take over-the-counter and prescription medicines only as told by your health care provider.  Do not use any products that contain nicotine or tobacco. These products include cigarettes, chewing tobacco, and vaping devices, such as e-cigarettes. If you need help quitting, ask your health care provider.  Get mammograms, pelvic exams, and medical checkups as often as told by your health care provider.  Keep all follow-up visits. This is important. Contact a health care provider if you have:  Pain or swelling in your legs.  Lumps or changes in your breasts or armpits.  Pain, burning, or bleeding when you urinate.  Unusual vaginal bleeding.  Dizziness or headaches.  Pain in your abdomen. Get help right away if you have:  Shortness of breath.  Chest pain.  Slurred speech.  Weakness or numbness in any part of your arms or legs. These symptoms may represent a serious problem that is an emergency. Do not wait to see if the symptoms will go away. Get medical help right away. Call your local emergency services (911 in the [place] Do not drive yourself to the hospital. Summary  Menopause is a normal time of life when menstrual periods stop completely and the ovaries stop producing the female hormones estrogen and progesterone.  HRT can reduce the frequency and severity of menopausal symptoms.  Risks of HRT vary depending on your individual health and medical history. This information is not intended to  replace advice given to you by your health care provider. Make sure you discuss any questions you have with your health care provider. Document Revised: 07/22/2019 Document Reviewed: 07/22/2019 Elsevier Patient Education  2021 Elsevier Inc. https://www.womenshealth.gov/menopause/menopause-basics"> https://www.clinicalkey.com">  Menopause Menopause is the normal time of a woman's life when menstrual periods stop completely. It marks the natural end to a woman's ability to become pregnant. It can be defined as the absence of a menstrual period for 12 months without another medical cause. The transition to menopause (perimenopause) most often happens between the ages of 45 and 55, and can last for many years. During perimenopause, hormone levels change in your body, which can cause symptoms and affect your health. Menopause may increase your risk for:  Weakened bones (osteoporosis), which causes fractures.  Depression.  Hardening and narrowing of the arteries (atherosclerosis), which can cause heart attacks and strokes. What are the causes? This condition is usually caused by a natural change in hormone levels that happens as you get older. The condition may also be caused by changes that are not natural, including:  Surgery to remove both ovaries (surgical menopause).  Side effects from some medicines, such   as chemotherapy used to treat cancer (chemical menopause). What increases the risk? This condition is more likely to start at an earlier age if you have certain medical conditions or have undergone treatments, including:  A tumor of the pituitary gland in the brain.  A disease that affects the ovaries and hormones.  Certain cancer treatments, such as chemotherapy or hormone therapy, or radiation therapy on the pelvis.  Heavy smoking and excessive alcohol use.  Family history of early menopause. This condition is also more likely to develop earlier in women who are very thin. What are  the signs or symptoms? Symptoms of this condition include:  Hot flashes.  Irregular menstrual periods.  Night sweats.  Changes in feelings about sex. This could be a decrease in sex drive or an increased discomfort around your sexuality.  Vaginal dryness and thinning of the vaginal walls. This may cause painful sex.  Dryness of the skin and development of wrinkles.  Headaches.  Problems sleeping (insomnia).  Mood swings or irritability.  Memory problems.  Weight gain.  Hair growth on the face and chest.  Bladder infections or problems with urinating. How is this diagnosed? This condition is diagnosed based on your medical history, a physical exam, your age, your menstrual history, and your symptoms. Hormone tests may also be done. How is this treated? In some cases, no treatment is needed. You and your health care provider should make a decision together about whether treatment is necessary. Treatment will be based on your individual condition and preferences. Treatment for this condition focuses on managing symptoms. Treatment may include:  Menopausal hormone therapy (MHT).  Medicines to treat specific symptoms or complications.  Acupuncture.  Vitamin or herbal supplements. Before starting treatment, make sure to let your health care provider know if you have a personal or family history of these conditions:  Heart disease.  Breast cancer.  Blood clots.  Diabetes.  Osteoporosis. Follow these instructions at home: Lifestyle  Do not use any products that contain nicotine or tobacco, such as cigarettes, e-cigarettes, and chewing tobacco. If you need help quitting, ask your health care provider.  Get at least 30 minutes of physical activity on 5 or more days each week.  Avoid alcoholic and caffeinated beverages, as well as spicy foods. This may help prevent hot flashes.  Get 7-8 hours of sleep each night.  If you have hot flashes, try: ? Dressing in  layers. ? Avoiding things that may trigger hot flashes, such as spicy food, warm places, or stress. ? Taking slow, deep breaths when a hot flash starts. ? Keeping a fan in your home and office.  Find ways to manage stress, such as deep breathing, meditation, or journaling.  Consider going to group therapy with other women who are having menopause symptoms. Ask your health care provider about recommended group therapy meetings. Eating and drinking  Eat a healthy, balanced diet that contains whole grains, lean protein, low-fat dairy, and plenty of fruits and vegetables.  Your health care provider may recommend adding more soy to your diet. Foods that contain soy include tofu, tempeh, and soy milk.  Eat plenty of foods that contain calcium and vitamin D for bone health. Items that are rich in calcium include low-fat milk, yogurt, beans, almonds, sardines, broccoli, and kale.   Medicines  Take over-the-counter and prescription medicines only as told by your health care provider.  Talk with your health care provider before starting any herbal supplements. If prescribed, take vitamins and supplements as   told by your health care provider. General instructions  Keep track of your menstrual periods, including: ? When they occur. ? How heavy they are and how long they last. ? How much time passes between periods.  Keep track of your symptoms, noting when they start, how often you have them, and how long they last.  Use vaginal lubricants or moisturizers to help with vaginal dryness and improve comfort during sex.  Keep all follow-up visits. This is important. This includes any group therapy or counseling.   Contact a health care provider if:  You are still having menstrual periods after age 55.  You have pain during sex.  You have not had a period for 12 months and you develop vaginal bleeding. Get help right away if you have:  Severe depression.  Excessive vaginal bleeding.  Pain  when you urinate.  A fast or irregular heartbeat (palpitations).  Severe headaches.  Abdominal pain or severe indigestion. Summary  Menopause is a normal time of life when menstrual periods stop completely. It is usually defined as the absence of a menstrual period for 12 months without another medical cause.  The transition to menopause (perimenopause) most often happens between the ages of 45 and 55 and can last for several years.  Symptoms can be managed through medicines, lifestyle changes, and complementary therapies such as acupuncture.  Eat a balanced diet that is rich in nutrients to promote bone health and heart health and to manage symptoms during menopause. This information is not intended to replace advice given to you by your health care provider. Make sure you discuss any questions you have with your health care provider. Document Revised: 10/18/2019 Document Reviewed: 07/04/2019 Elsevier Patient Education  2021 Elsevier Inc. Williams Textbook of Endocrinology (14th ed., pp. 574-641). [place], [place]: Elsevier.">  Perimenopause Perimenopause is the normal time of a woman's life when the levels of estrogen, the female hormone produced by the ovaries, begin to decrease. This leads to changes in menstrual periods before they stop completely (menopause). Perimenopause can begin 2-8 years before menopause. During perimenopause, the ovaries may or may not produce an egg and a woman can still become pregnant. What are the causes? This condition is caused by a natural change in hormone levels that happens as you get older. What increases the risk? This condition is more likely to start at an earlier age if you have certain medical conditions or have undergone treatments, including:  A tumor of the pituitary gland in the brain.  A disease that affects the ovaries and hormone production.  Certain cancer treatments, such as chemotherapy or hormone therapy, or radiation therapy on  the pelvis.  Heavy smoking and excessive alcohol use.  Family history of early menopause. What are the signs or symptoms? Perimenopausal changes affect each woman differently. Symptoms of this condition may include:  Hot flashes.  Irregular menstrual periods.  Night sweats.  Changes in feelings about sex. This could be a decrease in sex drive or an increased discomfort around your sexuality.  Vaginal dryness.  Headaches.  Mood swings.  Depression.  Problems sleeping (insomnia).  Memory problems or trouble concentrating.  Irritability.  Tiredness.  Weight gain.  Anxiety.  Trouble getting pregnant. How is this diagnosed? This condition is diagnosed based on your medical history, a physical exam, your age, your menstrual history, and your symptoms. Hormone tests may also be done. How is this treated? In some cases, no treatment is needed. You and your health care provider should make a   decision together about whether treatment is necessary. Treatment will be based on your individual condition and preferences. Various treatments are available, such as:  Menopausal hormone therapy (MHT).  Medicines to treat specific symptoms.  Acupuncture.  Vitamin or herbal supplements. Before starting treatment, make sure to let your health care provider know if you have a personal or family history of:  Heart disease.  Breast cancer.  Blood clots.  Diabetes.  Osteoporosis. Follow these instructions at home: Medicines  Take over-the-counter and prescription medicines only as told by your health care provider.  Take vitamin supplements only as told by your health care provider.  Talk with your health care provider before starting any herbal supplements. Lifestyle  Do not use any products that contain nicotine or tobacco, such as cigarettes, e-cigarettes, and chewing tobacco. If you need help quitting, ask your health care provider.  Get at least 30 minutes of  physical activity on 5 or more days each week.  Eat a balanced diet that includes fresh fruits and vegetables, whole grains, soybeans, eggs, lean meat, and low-fat dairy.  Avoid alcoholic and caffeinated beverages, as well as spicy foods. This may help prevent hot flashes.  Get 7-8 hours of sleep each night.  Dress in layers that can be removed to help you manage hot flashes.  Find ways to manage stress, such as deep breathing, meditation, or journaling.   General instructions  Keep track of your menstrual periods, including: ? When they occur. ? How heavy they are and how long they last. ? How much time passes between periods.  Keep track of your symptoms, noting when they start, how often you have them, and how long they last.  Use vaginal lubricants or moisturizers to help with vaginal dryness and improve comfort during sex.  You can still become pregnant if you are having irregular periods. Make sure you use contraception during perimenopause if you do not want to get pregnant.  Keep all follow-up visits. This is important. This includes any group therapy or counseling.   Contact a health care provider if:  You have heavy vaginal bleeding or pass blood clots.  Your period lasts more than 2 days longer than normal.  Your periods are recurring sooner than 21 days.  You bleed after having sex.  You have pain during sex. Get help right away if you have:  Chest pain, trouble breathing, or trouble talking.  Severe depression.  Pain when you urinate.  Severe headaches.  Vision problems. Summary  Perimenopause is the time when a woman's body begins to move into menopause. This may happen naturally or as a result of other health problems or medical treatments.  Perimenopause can begin 2-8 years before menopause, and it can last for several years.  Perimenopausal symptoms can be managed through medicines, lifestyle changes, and complementary therapies such as  acupuncture. This information is not intended to replace advice given to you by your health care provider. Make sure you discuss any questions you have with your health care provider. Document Revised: 07/04/2019 Document Reviewed: 07/04/2019 Elsevier Patient Education  2021 Elsevier Inc.  

## 2020-04-20 NOTE — Progress Notes (Signed)
  Subjective:     Patient ID: Brandi Oneill, female   DOB: 06/28/1975, 45 y.o.   MRN: [id number]  HPI Brandi Oneill is a 45 year old black female,single, G1P1 in complaining of irregular periods and hot flashes. PCP is Dr Allena Katz.   Review of Systems +irregular periods,skipping +hot flashes +moody +sleep disturbance Forgetful Decreased libido  Reviewed past medical,surgical, social and family history. Reviewed medications and allergies.      Objective:   Physical Exam BP (!) 148/96 (BP Location: Left Arm, Patient Position: Sitting, Cuff Size: Normal)   Pulse 94   Ht 5\' 6"  (1.676 m)   Wt 183 lb (83 kg)   LMP 04/15/2020 (Exact Date)   BMI 29.54 kg/m . Skin warm and dry. Neck: mid line trachea, normal thyroid, good ROM, no lymphadenopathy noted. Lungs: clear to ausculation bilaterally. Cardiovascular: regular rate and rhythm. AA is 2 Fall risk is low PHQ 9 score is 1 GAD 7 score is 5  Upstream - 04/20/20 1026      Pregnancy Intention Screening   Does the patient want to become pregnant in the next year? No    Does the patient's partner want to become pregnant in the next year? No    Would the patient like to discuss contraceptive options today? No      Contraception Wrap Up   Current Method Female Sterilization    End Method Female Sterilization    Contraception Counseling Provided No             Assessment:     1. Perimenopause Discussed HRT, low dose OC and SSRIs as options, will wait for noe  2. Hot flashes  3. Moody  4. Irregular periods  5. Sleep disturbance  6. Screening mammogram for breast cancer Mammogram scheduled for her 3/23 at 1:45pm at [facility]:     Return in 6 weeks for pap Has physical and labs with PCP Review handouts on menopause,menopause and HRT and perimenopause

## 2020-04-22 ENCOUNTER — Ambulatory Visit (HOSPITAL_COMMUNITY): Payer: 59

## 2020-04-22 ENCOUNTER — Encounter (HOSPITAL_COMMUNITY): Payer: Self-pay | Admitting: Occupational Therapy

## 2020-04-22 ENCOUNTER — Ambulatory Visit (HOSPITAL_COMMUNITY): Payer: 59 | Admitting: Occupational Therapy

## 2020-04-22 ENCOUNTER — Other Ambulatory Visit: Payer: Self-pay

## 2020-04-22 DIAGNOSIS — M25511 Pain in right shoulder: Secondary | ICD-10-CM

## 2020-04-22 DIAGNOSIS — M25611 Stiffness of right shoulder, not elsewhere classified: Secondary | ICD-10-CM | POA: Diagnosis not present

## 2020-04-22 DIAGNOSIS — R29898 Other symptoms and signs involving the musculoskeletal system: Secondary | ICD-10-CM

## 2020-04-22 NOTE — Therapy (Signed)
[facility] [address] [place], [place], [address] Phone: [phone]   Fax:  [phone]  Occupational Therapy Treatment  Patient Details  Name: Brandi Oneill MRN: [id number] Date of Birth: November 01, 1975 Referring Provider (OT): Julien Girt, [place]   Encounter Date: 04/22/2020   OT End of Session - 04/22/20 0818    Visit Number 4    Number of Visits 16    Date for OT Re-Evaluation 05/17/20    Authorization Type [employer]    Authorization Time Period 30 visit limit    Authorization - Visit Number 4    Authorization - Number of Visits 30    OT Start Time 0732    OT Stop Time 0815    OT Time Calculation (min) 43 min    Activity Tolerance Patient tolerated treatment well    Behavior During Therapy [facility] for tasks assessed/performed           Past Medical History:  Diagnosis Date  . Allergy   . Diabetes mellitus without complication [facility])     Past Surgical History:  Procedure Laterality Date  . ABLATION    . TUBAL LIGATION      There were no vitals filed for this visit.   Subjective Assessment - 04/22/20 0733    Subjective  S: That wall stretch is something.    Currently in Pain? No/denies              [facility] OT Assessment - 04/22/20 0733      Assessment   Medical Diagnosis right adhesive capsulitis      Precautions   Precautions None                    OT Treatments/Exercises (OP) - 04/22/20 0734      Exercises   Exercises Shoulder      Shoulder Exercises: Supine   Protraction PROM;5 reps    Horizontal ABduction PROM;5 reps    External Rotation PROM;5 reps    Internal Rotation PROM;5 reps    Flexion PROM;5 reps    ABduction PROM;5 reps      Shoulder Exercises: Standing   Protraction AROM;10 reps    Horizontal ABduction AROM;10 reps    External Rotation AROM;10 reps    Internal Rotation AROM;10 reps    Flexion AROM;10 reps    ABduction AROM;10 reps      Shoulder Exercises:  Pulleys   Flexion 1 minute    ABduction 1 minute      Shoulder Exercises: ROM/Strengthening   Other ROM/Strengthening Exercises ball pass behind back using small pink ball for IR, 10X    Other ROM/Strengthening Exercises rolling green therapy ball up wall into flexion, holding for 5", 10X      Shoulder Exercises: Stretch   Wall Stretch - Flexion 2 reps;10 seconds    Wall Stretch - ABduction 2 reps;10 seconds    Other Shoulder Stretches doorway stretch, 2x10"      Manual Therapy   Manual Therapy Myofascial release    Manual therapy comments Manual therapy completed prior to exercises.    Myofascial Release Myofascial release and manual stretching completing to right upper arm, trapezius, and scapularis region to decrease fascial restrictions and increase joint mobility in a pain free zone.                    OT Short Term Goals - 04/15/20 0757      OT SHORT TERM GOAL #1  Title Pt will be provided with and educated on HEP to improve mobility required for RUE use during ADLs.    Time 4    Period Weeks    Status On-going    Target Date 04/17/20      OT SHORT TERM GOAL #2   Title Pt will increase RUE P/ROM to [facility] to improve ability to perform dressing tasks without compensatory techniques.    Time 4    Period Weeks    Status On-going      OT SHORT TERM GOAL #3   Title Pt will increase RUE strength to 3+/5 to improve ability to reach items at chest to head height in cabinets.    Time 4    Period Weeks    Status Achieved             OT Long Term Goals - 04/15/20 0757      OT LONG TERM GOAL #1   Title Pt will decrease RUE pain to 3/10 or less to improve ability to sleep for 4+ consecutive hours without waking due to pain.    Time 8    Period Weeks    Status Achieved      OT LONG TERM GOAL #2   Title Pt will decrease RUE fascial restrictions to minimal amounts or less to improve mobility required for functional reaching tasks.    Time 8    Period Weeks     Status On-going      OT LONG TERM GOAL #3   Title Pt will increase RUE A/ROM to [facility] to improve ability to reach overhead and behind back during dressing and bathing tasks.    Time 8    Period Weeks    Status On-going      OT LONG TERM GOAL #4   Title Pt will increase RUE strength to 4+/5 to improve ability to perform cleaning tasks at work and home.    Time 8    Period Weeks    Status Achieved                 Plan - 04/22/20 0801    Clinical Impression Statement A: Pt reports soreness with HEP, reviewed for accuracy and adjusted wall flexion stretch for continued stretch with more comfort. Continued with myofascial release and passive stretching, added A/ROM in standing-pt with ROM WFL. Also added ball pass for IR, pt with good er behind head. Pt completing therapy ball stretch up the wall with sustained hold. Verbal cuing for form and technique.    Body Structure / Function / Physical Skills ADL;Endurance;UE functional use;Fascial restriction;Pain;ROM;IADL;Strength;Mobility    Plan P: follow up on HEP completion, add functional reaching task    OT Home Exercise Plan eval: table slides, heat/massage; 3/16: shoulder stretches    Consulted and Agree with Plan of Care Patient           Patient will benefit from skilled therapeutic intervention in order to improve the following deficits and impairments:   Body Structure / Function / Physical Skills: ADL,Endurance,UE functional use,Fascial restriction,Pain,ROM,IADL,Strength,Mobility       Visit Diagnosis: Stiffness of right shoulder, not elsewhere classified  Other symptoms and signs involving the musculoskeletal system  Acute pain of right shoulder    Problem List Patient Active Problem List   Diagnosis Date Noted  . Screening mammogram for breast cancer 04/20/2020  . Sleep disturbance 04/20/2020  . Irregular periods 04/20/2020  . Moody 04/20/2020  . Hot flashes 04/20/2020  .  Diabetic polyneuropathy associated  with type 2 diabetes mellitus (HCC) 04/14/2020  . Chronic right shoulder pain 03/03/2020  . Perimenopause 03/03/2020  . Onychomycosis 03/03/2020  . Essential hypertension, benign 02/04/2019  . Chronic midline low back pain without sciatica 01/13/2019  . Uncontrolled type 2 diabetes mellitus with complication, with long-term current use of insulin (HCC) 12/13/2018  . Thyroid disease 12/13/2018   Ezra Sites, OTR/L  [phone] 04/22/2020, 8:19 AM  [facility] [address] [place], [place], [address] Phone: [phone]   Fax:  [phone]  Name: Brandi Oneill MRN: [id number] Date of Birth: [birth year]

## 2020-04-27 ENCOUNTER — Other Ambulatory Visit: Payer: Self-pay | Admitting: Nurse Practitioner

## 2020-04-29 ENCOUNTER — Encounter (HOSPITAL_COMMUNITY): Payer: Self-pay | Admitting: Occupational Therapy

## 2020-04-29 ENCOUNTER — Other Ambulatory Visit: Payer: Self-pay

## 2020-04-29 ENCOUNTER — Ambulatory Visit (HOSPITAL_COMMUNITY): Payer: 59 | Admitting: Occupational Therapy

## 2020-04-29 DIAGNOSIS — R29898 Other symptoms and signs involving the musculoskeletal system: Secondary | ICD-10-CM

## 2020-04-29 DIAGNOSIS — M25511 Pain in right shoulder: Secondary | ICD-10-CM

## 2020-04-29 DIAGNOSIS — M25611 Stiffness of right shoulder, not elsewhere classified: Secondary | ICD-10-CM | POA: Diagnosis not present

## 2020-04-29 NOTE — Therapy (Signed)
[facility] [address] [place], [place], [address] Phone: [phone]   Fax:  [phone]  Occupational Therapy Treatment  Patient Details  Name: Brandi Oneill MRN: [id number] Date of Birth: 10-11-1975 Referring Provider (OT): Julien Girt, [place]   Encounter Date: 04/29/2020   OT End of Session - 04/29/20 0817    Visit Number 5    Number of Visits 16    Date for OT Re-Evaluation 05/17/20    Authorization Type [employer]    Authorization Time Period 30 visit limit    Authorization - Visit Number 5    Authorization - Number of Visits 30    OT Start Time [phone]   pt arrived late   OT Stop Time 0815    OT Time Calculation (min) 38 min    Activity Tolerance Patient tolerated treatment well    Behavior During Therapy [facility] for tasks assessed/performed           Past Medical History:  Diagnosis Date  . Allergy   . Diabetes mellitus without complication [facility])     Past Surgical History:  Procedure Laterality Date  . ABLATION    . TUBAL LIGATION      There were no vitals filed for this visit.   Subjective Assessment - 04/29/20 0738    Subjective  S: I go to the park and hang on the pull-up bar.    Currently in Pain? No/denies              [facility] OT Assessment - 04/29/20 0738      Assessment   Medical Diagnosis right adhesive capsulitis      Precautions   Precautions None                    OT Treatments/Exercises (OP) - 04/29/20 0739      Exercises   Exercises Shoulder      Shoulder Exercises: Supine   Protraction PROM;5 reps    Horizontal ABduction PROM;5 reps    External Rotation PROM;5 reps    Internal Rotation PROM;5 reps    Flexion PROM;5 reps    ABduction PROM;5 reps      Shoulder Exercises: Standing   Protraction AROM;12 reps    Horizontal ABduction AROM;12 reps    External Rotation AROM;12 reps    Internal Rotation AROM;12 reps    Flexion AROM;12 reps    ABduction AROM;12 reps     Other Standing Exercises Y lift off, 10X      Shoulder Exercises: ROM/Strengthening   Over Head Lace 1'    X to V Arms 10X    Ball on Wall 1' flexion, 1 rest break    Other ROM/Strengthening Exercises ball pass behind back using tennis ball for IR, 10X      Shoulder Exercises: Stretch   Other Shoulder Stretches shoulder flexion countertop stretch, 3x30"      Functional Reaching Activities   High Level pt placing cones on top shelf of overhead cabinet in flexion, removing in abduction, 10 cones      Manual Therapy   Manual Therapy Myofascial release    Manual therapy comments Manual therapy completed prior to exercises.    Myofascial Release Myofascial release and manual stretching completing to right upper arm, trapezius, and scapularis region to decrease fascial restrictions and increase joint mobility in a pain free zone.  OT Short Term Goals - 04/15/20 0757      OT SHORT TERM GOAL #1   Title Pt will be provided with and educated on HEP to improve mobility required for RUE use during ADLs.    Time 4    Period Weeks    Status On-going    Target Date 04/17/20      OT SHORT TERM GOAL #2   Title Pt will increase RUE P/ROM to [facility] to improve ability to perform dressing tasks without compensatory techniques.    Time 4    Period Weeks    Status On-going      OT SHORT TERM GOAL #3   Title Pt will increase RUE strength to 3+/5 to improve ability to reach items at chest to head height in cabinets.    Time 4    Period Weeks    Status Achieved             OT Long Term Goals - 04/15/20 0757      OT LONG TERM GOAL #1   Title Pt will decrease RUE pain to 3/10 or less to improve ability to sleep for 4+ consecutive hours without waking due to pain.    Time 8    Period Weeks    Status Achieved      OT LONG TERM GOAL #2   Title Pt will decrease RUE fascial restrictions to minimal amounts or less to improve mobility required for functional reaching  tasks.    Time 8    Period Weeks    Status On-going      OT LONG TERM GOAL #3   Title Pt will increase RUE A/ROM to [facility] to improve ability to reach overhead and behind back during dressing and bathing tasks.    Time 8    Period Weeks    Status On-going      OT LONG TERM GOAL #4   Title Pt will increase RUE strength to 4+/5 to improve ability to perform cleaning tasks at work and home.    Time 8    Period Weeks    Status Achieved                 Plan - 04/29/20 0755    Clinical Impression Statement A: Pt reports she has been practicing the shoulder flexion stretch as well as hanging on the pull-up bar. Continued with manual techniques, pt with muscle knot along right upper arm today. Pt completing countertop stretch with less discomfort than wall stretch today. Continued with A/ROM, added x to v arms and Y lift off, overhead lacing, and ball on wall. Pt requiring verbal cuing for form and technique.    Body Structure / Function / Physical Skills ADL;Endurance;UE functional use;Fascial restriction;Pain;ROM;IADL;Strength;Mobility    Plan P: Continue with functional reaching, add therapy ball strengthening    OT Home Exercise Plan eval: table slides, heat/massage; 3/16: shoulder stretches    Consulted and Agree with Plan of Care Patient           Patient will benefit from skilled therapeutic intervention in order to improve the following deficits and impairments:   Body Structure / Function / Physical Skills: ADL,Endurance,UE functional use,Fascial restriction,Pain,ROM,IADL,Strength,Mobility       Visit Diagnosis: Stiffness of right shoulder, not elsewhere classified  Other symptoms and signs involving the musculoskeletal system  Acute pain of right shoulder    Problem List Patient Active Problem List   Diagnosis Date Noted  . Screening mammogram for  breast cancer 04/20/2020  . Sleep disturbance 04/20/2020  . Irregular periods 04/20/2020  . Moody 04/20/2020  .  Hot flashes 04/20/2020  . Diabetic polyneuropathy associated with type 2 diabetes mellitus (HCC) 04/14/2020  . Chronic right shoulder pain 03/03/2020  . Perimenopause 03/03/2020  . Onychomycosis 03/03/2020  . Essential hypertension, benign 02/04/2019  . Chronic midline low back pain without sciatica 01/13/2019  . Uncontrolled type 2 diabetes mellitus with complication, with long-term current use of insulin (HCC) 12/13/2018  . Thyroid disease 12/13/2018   Ezra Sites, OTR/L  [phone] 04/29/2020, 8:19 AM  [facility] [address] [place], [place], [address] Phone: [phone]   Fax:  [phone]  Name: Brandi Oneill MRN: [id number] Date of Birth: [birth year]

## 2020-05-07 ENCOUNTER — Telehealth (HOSPITAL_COMMUNITY): Payer: Self-pay | Admitting: Occupational Therapy

## 2020-05-07 ENCOUNTER — Ambulatory Visit (HOSPITAL_COMMUNITY): Payer: 59 | Attending: Physician Assistant | Admitting: Occupational Therapy

## 2020-05-07 DIAGNOSIS — R29898 Other symptoms and signs involving the musculoskeletal system: Secondary | ICD-10-CM | POA: Insufficient documentation

## 2020-05-07 DIAGNOSIS — M25511 Pain in right shoulder: Secondary | ICD-10-CM | POA: Insufficient documentation

## 2020-05-07 DIAGNOSIS — M25611 Stiffness of right shoulder, not elsewhere classified: Secondary | ICD-10-CM | POA: Insufficient documentation

## 2020-05-07 NOTE — Telephone Encounter (Signed)
Called and spoke with pt regarding missed appt this morning. Reminded of next appt on 4/13 at 7:30 and asked pt to call if unable to attend.    Ezra Sites, OTR/L  [phone]

## 2020-05-08 ENCOUNTER — Ambulatory Visit (HOSPITAL_COMMUNITY): Admission: RE | Admit: 2020-05-08 | Payer: 59 | Source: Ambulatory Visit

## 2020-05-09 ENCOUNTER — Other Ambulatory Visit: Payer: Self-pay | Admitting: Internal Medicine

## 2020-05-09 DIAGNOSIS — E1142 Type 2 diabetes mellitus with diabetic polyneuropathy: Secondary | ICD-10-CM

## 2020-05-13 ENCOUNTER — Ambulatory Visit (HOSPITAL_COMMUNITY): Payer: 59 | Admitting: Occupational Therapy

## 2020-05-13 ENCOUNTER — Encounter (HOSPITAL_COMMUNITY): Payer: Self-pay | Admitting: Occupational Therapy

## 2020-05-13 ENCOUNTER — Other Ambulatory Visit: Payer: Self-pay

## 2020-05-13 DIAGNOSIS — M25511 Pain in right shoulder: Secondary | ICD-10-CM

## 2020-05-13 DIAGNOSIS — R29898 Other symptoms and signs involving the musculoskeletal system: Secondary | ICD-10-CM | POA: Diagnosis present

## 2020-05-13 DIAGNOSIS — M25611 Stiffness of right shoulder, not elsewhere classified: Secondary | ICD-10-CM | POA: Diagnosis present

## 2020-05-13 NOTE — Therapy (Signed)
[facility] [address] [place], [place], [address] Phone: [phone]   Fax:  [phone]  Occupational Therapy Treatment  Patient Details  Name: Brandi Oneill MRN: [id number] Date of Birth: 08/06/1975 Referring Provider (OT): Julien Girt, [place]   Encounter Date: 05/13/2020   OT End of Session - 05/13/20 0810    Visit Number 6    Number of Visits 16    Date for OT Re-Evaluation 05/17/20    Authorization Type [employer]    Authorization Time Period 30 visit limit    Authorization - Visit Number 6    Authorization - Number of Visits 30    OT Start Time 0734    OT Stop Time [phone]    OT Time Calculation (min) 38 min    Activity Tolerance Patient tolerated treatment well    Behavior During Therapy [facility] for tasks assessed/performed           Past Medical History:  Diagnosis Date  . Allergy   . Diabetes mellitus without complication [facility])     Past Surgical History:  Procedure Laterality Date  . ABLATION    . TUBAL LIGATION      There were no vitals filed for this visit.   Subjective Assessment - 05/13/20 0734    Subjective  S: I saw the doctor and he released me.    Currently in Pain? No/denies              [facility] OT Assessment - 05/13/20 0734      Assessment   Medical Diagnosis right adhesive capsulitis      Precautions   Precautions None                    OT Treatments/Exercises (OP) - 05/13/20 0735      Exercises   Exercises Shoulder      Shoulder Exercises: Supine   Protraction PROM;5 reps    Horizontal ABduction PROM;5 reps    External Rotation PROM;5 reps    Internal Rotation PROM;5 reps    Flexion PROM;5 reps    ABduction PROM;5 reps      Shoulder Exercises: Standing   Protraction AROM;12 reps    Horizontal ABduction AROM;12 reps    External Rotation AROM;12 reps    Internal Rotation AROM;12 reps    Flexion AROM;12 reps    ABduction AROM;12 reps    Extension Theraband;10  reps    Theraband Level (Shoulder Extension) Level 2 (Red)    Row Theraband;10 reps    Theraband Level (Shoulder Row) Level 2 (Red)    Retraction Theraband;10 reps    Theraband Level (Shoulder Retraction) Level 2 (Red)      Shoulder Exercises: ROM/Strengthening   UBE (Upper Arm Bike) Level 1 3' reverse, pace: 5.0    X to V Arms 10X      Shoulder Exercises: Stretch   Internal Rotation Stretch 2 reps   30" holds, horizontal towel   External Rotation Stretch 2 reps;30 seconds    Wall Stretch - Flexion 2 reps;30 seconds    Wall Stretch - ABduction 2 reps;30 seconds      Functional Reaching Activities   High Level Pt placing squigz on doorway overhead in flexion, removing in abduction.      Manual Therapy   Manual Therapy Myofascial release    Manual therapy comments Manual therapy completed prior to exercises.    Myofascial Release Myofascial release and manual stretching completing to right upper  arm, trapezius, and scapularis region to decrease fascial restrictions and increase joint mobility in a pain free zone.                  OT Education - 05/13/20 0747    Education Details shoulder A/ROM    Person(s) Educated Patient    Methods Explanation;Demonstration;Handout    Comprehension Verbalized understanding;Returned demonstration            OT Short Term Goals - 04/15/20 0757      OT SHORT TERM GOAL #1   Title Pt will be provided with and educated on HEP to improve mobility required for RUE use during ADLs.    Time 4    Period Weeks    Status On-going    Target Date 04/17/20      OT SHORT TERM GOAL #2   Title Pt will increase RUE P/ROM to [facility] to improve ability to perform dressing tasks without compensatory techniques.    Time 4    Period Weeks    Status On-going      OT SHORT TERM GOAL #3   Title Pt will increase RUE strength to 3+/5 to improve ability to reach items at chest to head height in cabinets.    Time 4    Period Weeks    Status Achieved              OT Long Term Goals - 04/15/20 0757      OT LONG TERM GOAL #1   Title Pt will decrease RUE pain to 3/10 or less to improve ability to sleep for 4+ consecutive hours without waking due to pain.    Time 8    Period Weeks    Status Achieved      OT LONG TERM GOAL #2   Title Pt will decrease RUE fascial restrictions to minimal amounts or less to improve mobility required for functional reaching tasks.    Time 8    Period Weeks    Status On-going      OT LONG TERM GOAL #3   Title Pt will increase RUE A/ROM to [facility] to improve ability to reach overhead and behind back during dressing and bathing tasks.    Time 8    Period Weeks    Status On-going      OT LONG TERM GOAL #4   Title Pt will increase RUE strength to 4+/5 to improve ability to perform cleaning tasks at work and home.    Time 8    Period Weeks    Status Achieved                 Plan - 05/13/20 0748    Clinical Impression Statement A: Pt reports MD has released her, she is not having pain during the day and only occasionally at night. Continued with myofascial release and passive stretching, A/ROM in standing. Continued with shoulder stretches at wall, functional reaching tasks. Also added scapular theraband and UBE. Verbal cuing for form and technique.    Body Structure / Function / Physical Skills ADL;Endurance;UE functional use;Fascial restriction;Pain;ROM;IADL;Strength;Mobility    Plan P: Reassessment, update HEP, discharge    OT Home Exercise Plan eval: table slides, heat/massage; 3/16: shoulder stretches; 4/13: shoulder A/ROM    Consulted and Agree with Plan of Care Patient           Patient will benefit from skilled therapeutic intervention in order to improve the following deficits and impairments:   Body Structure /  Function / Physical Skills: ADL,Endurance,UE functional use,Fascial restriction,Pain,ROM,IADL,Strength,Mobility       Visit Diagnosis: Stiffness of right shoulder, not  elsewhere classified  Other symptoms and signs involving the musculoskeletal system  Acute pain of right shoulder    Problem List Patient Active Problem List   Diagnosis Date Noted  . Screening mammogram for breast cancer 04/20/2020  . Sleep disturbance 04/20/2020  . Irregular periods 04/20/2020  . Moody 04/20/2020  . Hot flashes 04/20/2020  . Diabetic polyneuropathy associated with type 2 diabetes mellitus (HCC) 04/14/2020  . Chronic right shoulder pain 03/03/2020  . Perimenopause 03/03/2020  . Onychomycosis 03/03/2020  . Essential hypertension, benign 02/04/2019  . Chronic midline low back pain without sciatica 01/13/2019  . Uncontrolled type 2 diabetes mellitus with complication, with long-term current use of insulin (HCC) 12/13/2018  . Thyroid disease 12/13/2018    Ezra Sites, OTR/L  [phone] 05/13/2020, 8:12 AM  [facility] [address] [place], [place], [address] Phone: [phone]   Fax:  [phone]  Name: Brandi Oneill MRN: [id number] Date of Birth: [birth year]

## 2020-05-13 NOTE — Patient Instructions (Signed)

## 2020-05-15 ENCOUNTER — Other Ambulatory Visit: Payer: Self-pay

## 2020-05-15 ENCOUNTER — Ambulatory Visit (HOSPITAL_COMMUNITY): Payer: 59 | Admitting: Occupational Therapy

## 2020-05-15 ENCOUNTER — Encounter (HOSPITAL_COMMUNITY): Payer: Self-pay | Admitting: Occupational Therapy

## 2020-05-15 DIAGNOSIS — R29898 Other symptoms and signs involving the musculoskeletal system: Secondary | ICD-10-CM

## 2020-05-15 DIAGNOSIS — M25511 Pain in right shoulder: Secondary | ICD-10-CM

## 2020-05-15 DIAGNOSIS — M25611 Stiffness of right shoulder, not elsewhere classified: Secondary | ICD-10-CM

## 2020-05-15 NOTE — Therapy (Signed)
[place], [place], [address] Phone: [phone]   Fax:  [phone]  Occupational Therapy Reassessment, Treatment, Discharge Summary  Patient Details  Name: Brandi Oneill MRN: [id number] Date of Birth: 1975/12/10 Referring Provider (OT): Matthew Saras, [place]   Encounter Date: 05/15/2020   OT End of Session - 05/15/20 0813    Visit Number 7    Number of Visits 16    Date for OT Re-Evaluation 05/17/20    Authorization Type [employer]    Authorization Time Period 30 visit limit    Authorization - Visit Number 7    Authorization - Number of Visits 30    OT Start Time 0745   pt arrived late   OT Stop Time 0808    OT Time Calculation (min) 23 min    Activity Tolerance Patient tolerated treatment well    Behavior During Therapy [facility] for tasks assessed/performed           Past Medical History:  Diagnosis Date  . Allergy   . Diabetes mellitus without complication [facility])     Past Surgical History:  Procedure Laterality Date  . ABLATION    . TUBAL LIGATION      There were no vitals filed for this visit.   Subjective Assessment - 05/15/20 0745    Subjective  S: I'm driving my 5 speed.    Currently in Pain? No/denies              [facility] OT Assessment - 05/15/20 0745      Assessment   Medical Diagnosis right adhesive capsulitis      Precautions   Precautions None      Observation/Other Assessments   Focus on Therapeutic Outcomes (FOTO)  83/100   52/100 previous     Palpation   Palpation comment min fascial restrictions in upper arm, anterior shoulder, trapezius, and scapular regions      AROM   Overall AROM Comments Assessed seated, er/IR adducted    AROM Assessment Site Shoulder    Right/Left Shoulder Right    Right Shoulder Flexion 135 Degrees   126 previous   Right Shoulder ABduction 140 Degrees   126 previous   Right Shoulder Internal Rotation 90 Degrees   same as previous   Right  Shoulder External Rotation 55 Degrees   same as previous     PROM   Overall PROM Comments Assessed supine, er/IR adducted    PROM Assessment Site Shoulder    Right/Left Shoulder Right    Right Shoulder Flexion 148 Degrees   122 previous   Right Shoulder ABduction 170 Degrees   119 previous   Right Shoulder Internal Rotation 90 Degrees   90 previous   Right Shoulder External Rotation 40 Degrees   25 previous     Strength   Overall Strength Comments Assessed seated, er/IR adducted    Strength Assessment Site Shoulder    Right/Left Shoulder Right    Right Shoulder Flexion 5/5   same as previous   Right Shoulder ABduction 5/5   4+/5 previous   Right Shoulder Internal Rotation 5/5   same as previous   Right Shoulder External Rotation 4+/5   4+/5 previous                   OT Treatments/Exercises (OP) - 05/15/20 0752      Exercises   Exercises Shoulder      Shoulder Exercises: Standing   Protraction AROM;12  reps    Horizontal ABduction AROM;12 reps    External Rotation AROM;12 reps    Internal Rotation AROM;12 reps    Flexion AROM;12 reps    ABduction AROM;12 reps    Extension Theraband;10 reps    Theraband Level (Shoulder Extension) Level 2 (Red)    Row Theraband;10 reps    Theraband Level (Shoulder Row) Level 2 (Red)    Retraction Theraband;10 reps    Theraband Level (Shoulder Retraction) Level 2 (Red)                  OT Education - 05/15/20 0757    Education Details red scapular theraband    Person(s) Educated Patient    Methods Explanation;Demonstration;Handout    Comprehension Verbalized understanding;Returned demonstration            OT Short Term Goals - 05/15/20 0758      OT SHORT TERM GOAL #1   Title Pt will be provided with and educated on HEP to improve mobility required for RUE use during ADLs.    Time 4    Period Weeks    Status Achieved    Target Date 04/17/20      OT SHORT TERM GOAL #2   Title Pt will increase RUE P/ROM to [facility]  to improve ability to perform dressing tasks without compensatory techniques.    Time 4    Period Weeks    Status Achieved      OT SHORT TERM GOAL #3   Title Pt will increase RUE strength to 3+/5 to improve ability to reach items at chest to head height in cabinets.    Time 4    Period Weeks    Status Achieved             OT Long Term Goals - 05/15/20 0758      OT LONG TERM GOAL #1   Title Pt will decrease RUE pain to 3/10 or less to improve ability to sleep for 4+ consecutive hours without waking due to pain.    Time 8    Period Weeks    Status Achieved      OT LONG TERM GOAL #2   Title Pt will decrease RUE fascial restrictions to minimal amounts or less to improve mobility required for functional reaching tasks.    Time 8    Period Weeks    Status Achieved      OT LONG TERM GOAL #3   Title Pt will increase RUE A/ROM to [facility] to improve ability to reach overhead and behind back during dressing and bathing tasks.    Time 8    Period Weeks    Status Partially Met      OT LONG TERM GOAL #4   Title Pt will increase RUE strength to 4+/5 to improve ability to perform cleaning tasks at work and home.    Time 8    Period Weeks    Status Achieved                 Plan - 05/15/20 0758    Clinical Impression Statement A: Reassessment completed this session, pt has met all STGs and 3/4 LTGs with remaining LTG partially met. Pt has made great improvements in pain, fascial restrictions, ROM, and strength of the RUE. Pt reports she is completing ADLs, cleaning her house, and lifting groceries without difficulty. Pt reports she only has mild pain at night occasionally, after sustained use. Reviewed and updated HEP, pt is agreeable  to discharge today.    Body Structure / Function / Physical Skills ADL;Endurance;UE functional use;Fascial restriction;Pain;ROM;IADL;Strength;Mobility    Plan P: Discharge pt    OT Home Exercise Plan eval: table slides, heat/massage; 3/16: shoulder  stretches; 4/13: shoulder A/ROM; 4/15: red scapular theraband    Consulted and Agree with Plan of Care Patient           Patient will benefit from skilled therapeutic intervention in order to improve the following deficits and impairments:   Body Structure / Function / Physical Skills: ADL,Endurance,UE functional use,Fascial restriction,Pain,ROM,IADL,Strength,Mobility       Visit Diagnosis: Stiffness of right shoulder, not elsewhere classified  Other symptoms and signs involving the musculoskeletal system  Acute pain of right shoulder    Problem List Patient Active Problem List   Diagnosis Date Noted  . Screening mammogram for breast cancer 04/20/2020  . Sleep disturbance 04/20/2020  . Irregular periods 04/20/2020  . Moody 04/20/2020  . Hot flashes 04/20/2020  . Diabetic polyneuropathy associated with type 2 diabetes mellitus ([place]) 04/14/2020  . Chronic right shoulder pain 03/03/2020  . Perimenopause 03/03/2020  . Onychomycosis 03/03/2020  . Essential hypertension, benign 02/04/2019  . Chronic midline low back pain without sciatica 01/13/2019  . Uncontrolled type 2 diabetes mellitus with complication, with long-term current use of insulin ([place]) 12/13/2018  . Thyroid disease 12/13/2018   Guadelupe Sabin, OTR/L  [phone] 05/15/2020, 8:13 AM  [facility] [address] [place], [place], [address] Phone: [phone]   Fax:  [phone]  Name: Brandi Oneill MRN: [id number] Date of Birth: 07-Sep-1975   OCCUPATIONAL THERAPY DISCHARGE SUMMARY  Visits from Start of Care: 7  Current functional level related to goals / functional outcomes: See above. Pt has met all STGs, 3/4 LTGs, partially met remaining goal. Pt is perform ADLs and I/ADLs independently.    Remaining deficits: A/ROM deficits   Education / Equipment: HEP Plan: Patient agrees to discharge.  Patient goals were met. Patient is being discharged due to meeting  the stated rehab goals.  ?????

## 2020-05-15 NOTE — Patient Instructions (Signed)

## 2020-05-20 ENCOUNTER — Encounter (HOSPITAL_COMMUNITY): Payer: 59 | Admitting: Occupational Therapy

## 2020-05-26 ENCOUNTER — Other Ambulatory Visit: Payer: Self-pay | Admitting: Internal Medicine

## 2020-05-26 DIAGNOSIS — E1142 Type 2 diabetes mellitus with diabetic polyneuropathy: Secondary | ICD-10-CM

## 2020-05-28 ENCOUNTER — Encounter (HOSPITAL_COMMUNITY): Payer: 59 | Admitting: Occupational Therapy

## 2020-06-04 ENCOUNTER — Other Ambulatory Visit: Payer: 59 | Admitting: Adult Health

## 2020-06-16 ENCOUNTER — Other Ambulatory Visit: Payer: Self-pay | Admitting: Nurse Practitioner

## 2020-06-25 ENCOUNTER — Ambulatory Visit: Payer: 59 | Admitting: Nurse Practitioner

## 2020-07-09 ENCOUNTER — Other Ambulatory Visit: Payer: Self-pay | Admitting: Internal Medicine

## 2020-07-09 DIAGNOSIS — E1142 Type 2 diabetes mellitus with diabetic polyneuropathy: Secondary | ICD-10-CM

## 2020-07-13 IMAGING — MG DIGITAL SCREENING BILAT W/ TOMO W/ CAD
8 series · 8 of 24 positions shown · non-contrast
Comparison: None.

CLINICAL DATA: Screening.

EXAM:
DIGITAL SCREENING BILATERAL MAMMOGRAM WITH TOMO AND CAD

[L CC synth-2D]
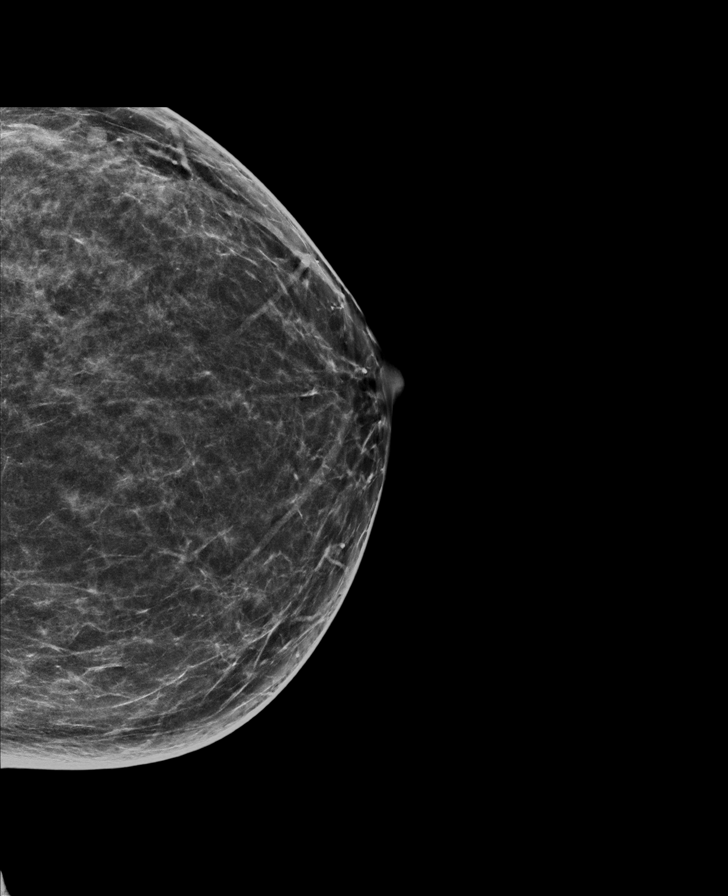

[R MLO synth-2D]
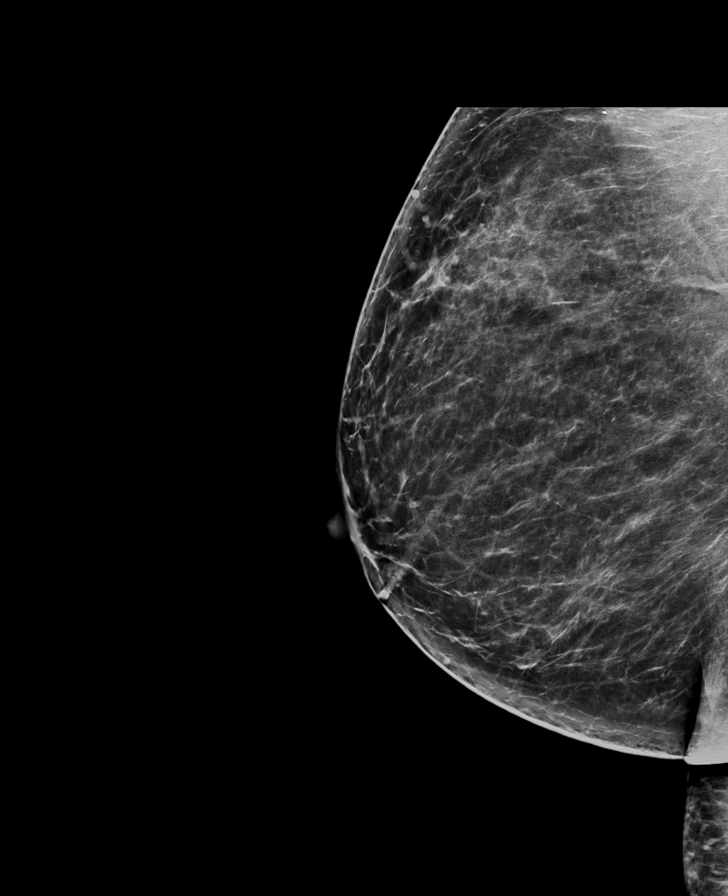

[R CC synth-2D]
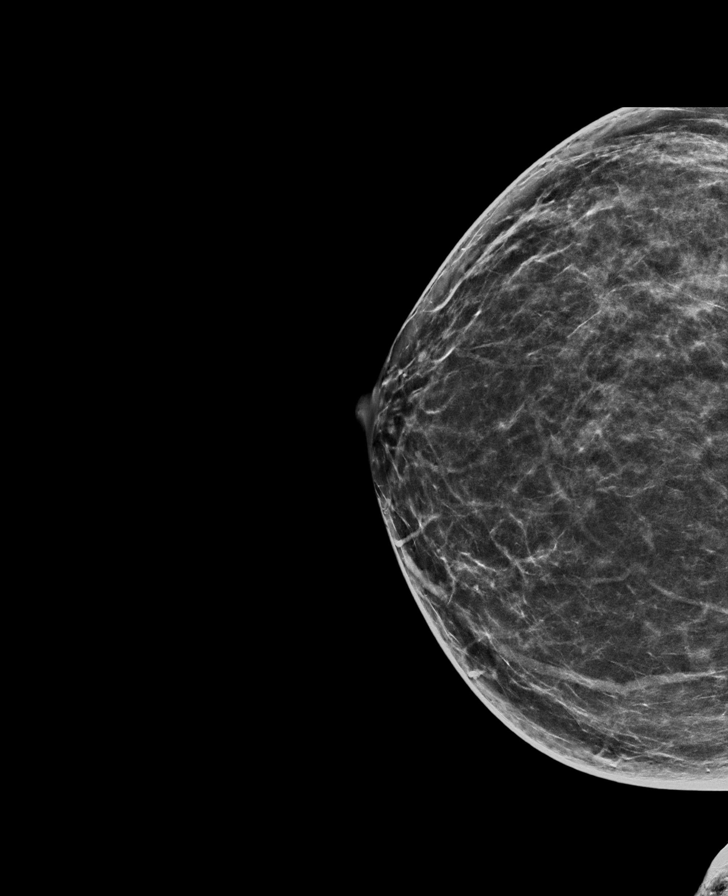

[L MLO synth-2D]
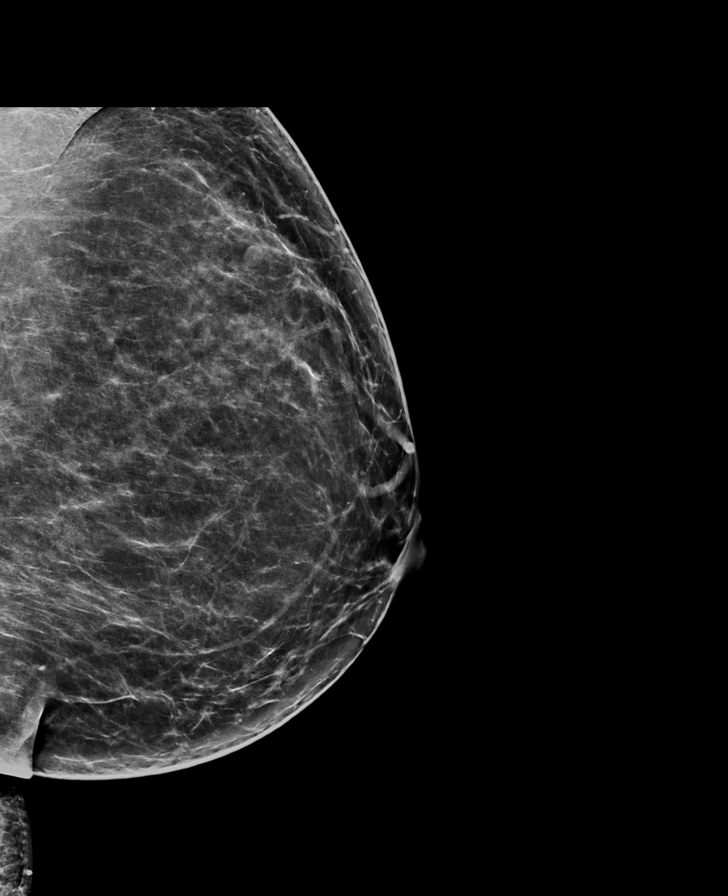

[L CC tomo · tomo slice 39/77.0]
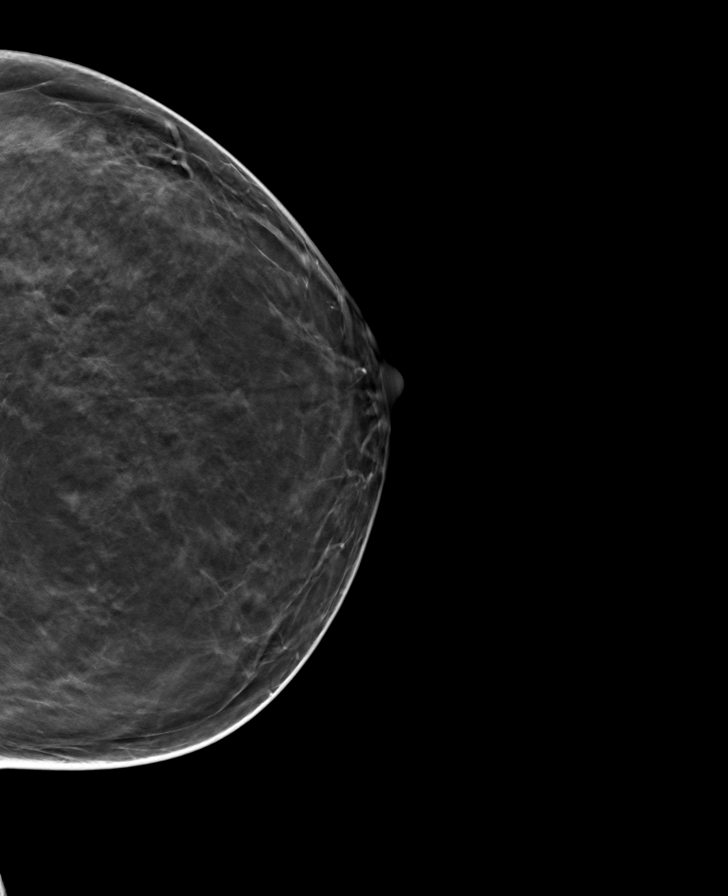

[L MLO tomo · tomo slice 43/84.0]
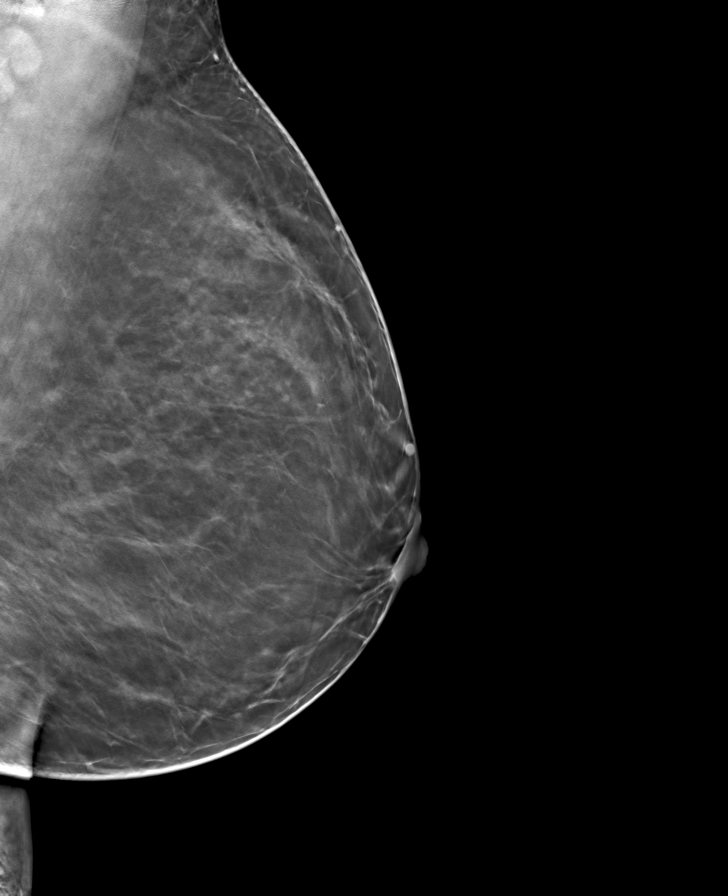

[R CC tomo · tomo slice 38/75.0]
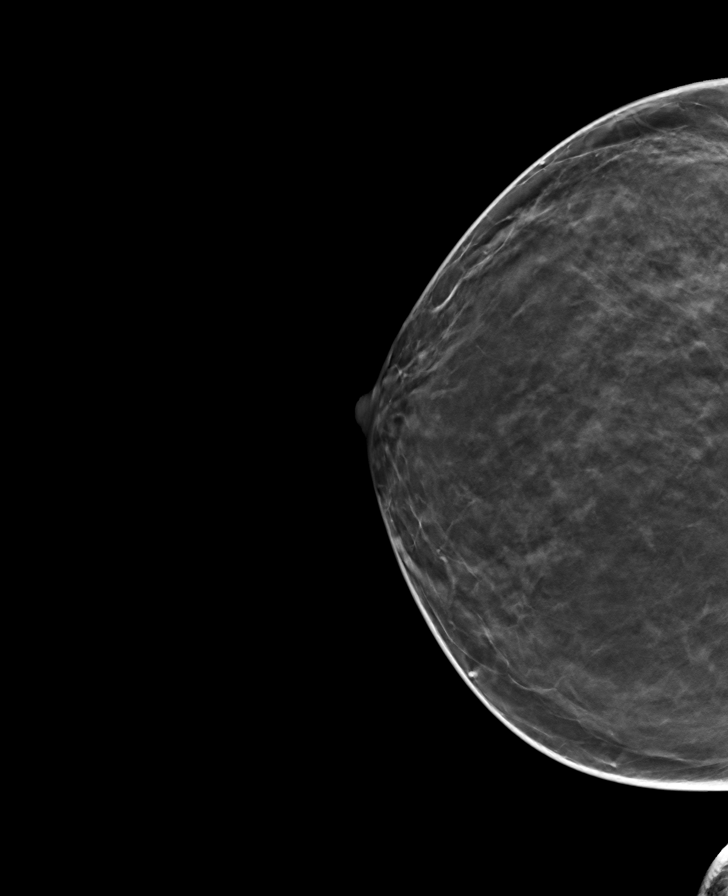

[R MLO tomo · tomo slice 42/83.0]
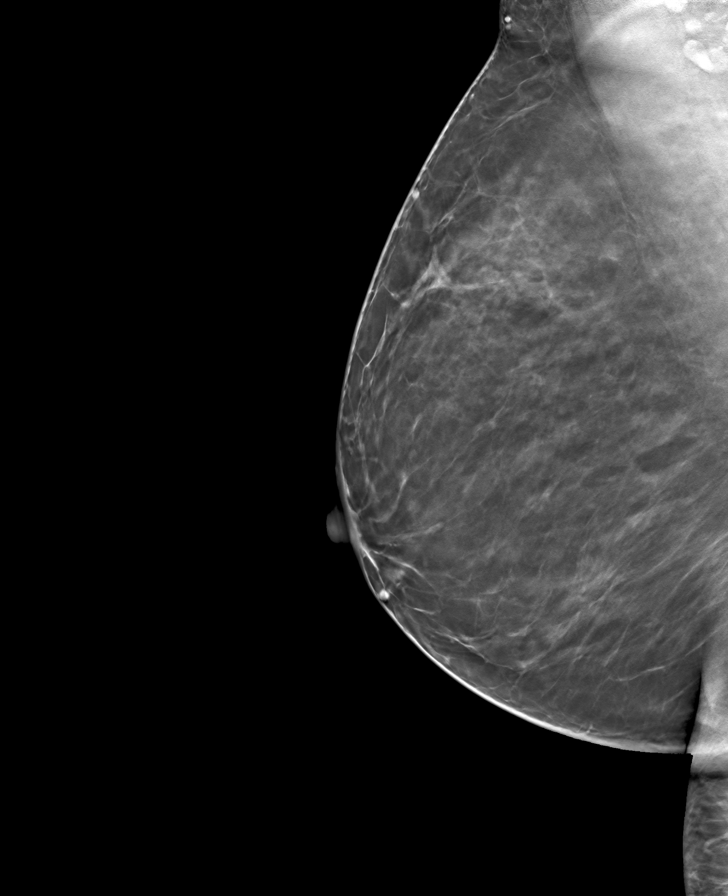

[8 of 24 positions shown; findings below may reference images not displayed]

ACR Breast Density Category b: There are scattered areas of
fibroglandular density.
FINDINGS: In the left breast, a possible mass warrants further evaluation. In
the right breast, no findings suspicious for malignancy. Images were
processed with CAD.
IMPRESSION: Further evaluation is suggested for possible mass in the left
breast.

RECOMMENDATION:
Ultrasound of the left breast. (Code:[id number]-[id number])

The patient will be contacted regarding the findings, and additional
imaging will be scheduled.

BI-RADS CATEGORY  0: Incomplete. Need additional imaging evaluation
and/or prior mammograms for comparison.

## 2020-07-14 ENCOUNTER — Encounter: Payer: 59 | Admitting: Internal Medicine

## 2020-08-02 ENCOUNTER — Other Ambulatory Visit: Payer: Self-pay | Admitting: "Endocrinology

## 2020-08-04 ENCOUNTER — Other Ambulatory Visit: Payer: Self-pay | Admitting: "Endocrinology

## 2020-08-18 ENCOUNTER — Telehealth: Payer: Self-pay

## 2020-08-18 NOTE — Telephone Encounter (Signed)
Pt's pharmacy is requesting refill on her Metformin. Patient missed last appt and does not have one scheduld.  Pleased advise  The pharmacy requesting is [facility]. Phone number [phone]

## 2020-08-18 NOTE — Telephone Encounter (Signed)
Tried to call pt , no answer no vm, she needs labs and appt

## 2020-08-18 NOTE — Telephone Encounter (Signed)
Have her schedule an appointment first before refilling

## 2020-08-20 NOTE — Telephone Encounter (Signed)
Joy, I have called this pt 2x to schedule an appt per Whitney, she is not refilling any medication until seen. [facility] keeps leaving voicemail stating they have faxed and called 4x, just wanted you to be aware.

## 2020-08-20 NOTE — Telephone Encounter (Signed)
Faxed pharmacy that pt must make an appointment before we can refill any medication.

## 2020-08-25 ENCOUNTER — Other Ambulatory Visit: Payer: Self-pay | Admitting: "Endocrinology

## 2020-08-28 ENCOUNTER — Other Ambulatory Visit: Payer: Self-pay | Admitting: "Endocrinology

## 2020-08-29 LAB — COMPREHENSIVE METABOLIC PANEL
ALT: 9 IU/L (ref 0–32)
AST: 12 IU/L (ref 0–40)
Albumin/Globulin Ratio: 1.7 (ref 1.2–2.2)
Albumin: 4.7 g/dL (ref 3.8–4.8)
Alkaline Phosphatase: 82 IU/L (ref 44–121)
BUN/Creatinine Ratio: 19 (ref 9–23)
BUN: 16 mg/dL (ref 6–24)
Bilirubin Total: 0.2 mg/dL (ref 0.0–1.2)
CO2: 19 mmol/L — ABNORMAL LOW (ref 20–29)
Calcium: 9.7 mg/dL (ref 8.7–10.2)
Chloride: 98 mmol/L (ref 96–106)
Creatinine, Ser: 0.84 mg/dL (ref 0.57–1.00)
Globulin, Total: 2.8 g/dL (ref 1.5–4.5)
Glucose: 220 mg/dL — ABNORMAL HIGH (ref 65–99)
Potassium: 4.6 mmol/L (ref 3.5–5.2)
Sodium: 133 mmol/L — ABNORMAL LOW (ref 134–144)
Total Protein: 7.5 g/dL (ref 6.0–8.5)
eGFR: 87 mL/min/{1.73_m2} (ref >59)

## 2020-08-29 LAB — VITAMIN D 25 HYDROXY (VIT D DEFICIENCY, FRACTURES): Vit D, 25-Hydroxy: 13.2 ng/mL — ABNORMAL LOW (ref 30.0–100.0)

## 2020-09-03 ENCOUNTER — Other Ambulatory Visit: Payer: Self-pay

## 2020-09-10 ENCOUNTER — Ambulatory Visit: Payer: 59 | Admitting: Nurse Practitioner

## 2020-09-10 DIAGNOSIS — Z794 Long term (current) use of insulin: Secondary | ICD-10-CM

## 2020-09-10 DIAGNOSIS — E559 Vitamin D deficiency, unspecified: Secondary | ICD-10-CM

## 2020-09-10 DIAGNOSIS — I10 Essential (primary) hypertension: Secondary | ICD-10-CM

## 2020-09-17 NOTE — Patient Instructions (Signed)

## 2020-09-18 ENCOUNTER — Other Ambulatory Visit: Payer: Self-pay

## 2020-09-18 ENCOUNTER — Ambulatory Visit (INDEPENDENT_AMBULATORY_CARE_PROVIDER_SITE_OTHER): Payer: 59 | Admitting: Nurse Practitioner

## 2020-09-18 ENCOUNTER — Encounter: Payer: Self-pay | Admitting: Nurse Practitioner

## 2020-09-18 VITALS — BP 137/82 | HR 90 | Ht 66.0 in | Wt 181.0 lb

## 2020-09-18 DIAGNOSIS — I10 Essential (primary) hypertension: Secondary | ICD-10-CM

## 2020-09-18 DIAGNOSIS — E118 Type 2 diabetes mellitus with unspecified complications: Secondary | ICD-10-CM | POA: Diagnosis not present

## 2020-09-18 DIAGNOSIS — E559 Vitamin D deficiency, unspecified: Secondary | ICD-10-CM

## 2020-09-18 DIAGNOSIS — Z794 Long term (current) use of insulin: Secondary | ICD-10-CM

## 2020-09-18 LAB — POCT GLYCOSYLATED HEMOGLOBIN (HGB A1C): Hemoglobin A1C: 9.6 % — AB (ref 4.0–5.6)

## 2020-09-18 MED ORDER — VITAMIN D (ERGOCALCIFEROL) 1.25 MG (50000 UNIT) PO CAPS: 50000.0000 [IU] | ORAL_CAPSULE | ORAL | 0 refills | Status: DC

## 2020-09-18 MED ORDER — METFORMIN HCL ER 500 MG PO TB24: 500.0000 mg | ORAL_TABLET | Freq: Every day | ORAL | 3 refills | Status: DC

## 2020-09-18 MED ORDER — GLIPIZIDE ER 5 MG PO TB24: 5.0000 mg | ORAL_TABLET | Freq: Every day | ORAL | 3 refills | Status: DC

## 2020-09-18 NOTE — Progress Notes (Signed)
09/18/2020, 8:53 AM         Endocrinology follow-up note   Subjective:    Patient ID: Brandi Oneill, female    DOB: Dec 28, 1975.  Brandi Oneill is being seen in follow-up for management of currently uncontrolled symptomatic diabetes requested by  Lindell Spar, MD.   Past Medical History:  Diagnosis Date   Allergy    Diabetes mellitus without complication [facility])     Past Surgical History:  Procedure Laterality Date   ABLATION     TUBAL LIGATION      Social History   Socioeconomic History   Marital status: Single    Spouse name: Not on file   Number of children: Not on file   Years of education: Not on file   Highest education level: Not on file  Occupational History   Occupation: shipping & receiving  Tobacco Use   Smoking status: Never   Smokeless tobacco: Never  Vaping Use   Vaping Use: Never used  Substance and Sexual Activity   Alcohol use: Yes   Drug use: Not Currently   Sexual activity: Yes    Birth control/protection: Surgical    Comment: tubal  Other Topics Concern   Not on file  Social History Narrative   Not on file   Social Determinants of Health   Financial Resource Strain: Medium Risk   Difficulty of Paying Living Expenses: Somewhat hard  Food Insecurity: Food Insecurity Present   Worried About Charity fundraiser in the Last Year: Sometimes true   Ran Out of Food in the Last Year: Sometimes true  Transportation Needs: Unmet Transportation Needs   Lack of Transportation (Medical): No   Lack of Transportation (Non-Medical): Yes  Physical Activity: Inactive   Days of Exercise per Week: 0 days   Minutes of Exercise per Session: 0 min  Stress: Stress Concern Present   Feeling of Stress : To some extent  Social Connections: Moderately Isolated   Frequency of Communication with Friends and Family: Twice a week   Frequency of Social Gatherings with Friends  and Family: Once a week   Attends Religious Services: Never   Marine scientist or Organizations: No   Attends Music therapist: Never   Marital Status: Living with partner    Family History  Problem Relation Age of Onset   Diabetes Mother    Hypertension Mother    Diabetes Father    Hypertension Father    Gout Brother     Outpatient Encounter Medications as of 09/18/2020  Medication Sig   Vitamin D, Ergocalciferol, (DRISDOL) 1.25 MG (50000 UNIT) CAPS capsule Take 1 capsule (50,000 Units total) by mouth every 7 (seven) days.   blood glucose meter kit and supplies Check blood glucose fasting, before lunch, before dinner and at bedtime   gabapentin (NEURONTIN) 100 MG capsule TAKE 1 CAPSULE(100 MG) BY MOUTH TWICE DAILY   glipiZIDE (GLUCOTROL XL) 5 MG 24 hr tablet Take 1 tablet (5 mg total) by mouth daily with breakfast.   ibuprofen (ADVIL) 600 MG tablet Take 1 tablet (600 mg total) by mouth 2 (  two) times daily as needed for moderate pain.   Insulin Pen Needle (B-D ULTRAFINE III SHORT PEN) 31G X 8 MM MISC 1 each by Does not apply route 4 (four) times daily.   meloxicam (MOBIC) 15 MG tablet Take 15 mg by mouth daily.   metFORMIN (GLUCOPHAGE-XR) 500 MG 24 hr tablet Take 1 tablet (500 mg total) by mouth daily with breakfast.   ONETOUCH ULTRA test strip 1 each by Other route 2 (two) times daily. DX E11.8   terbinafine (LAMISIL) 250 MG tablet Take 1 tablet (250 mg total) by mouth daily. (Patient not taking: Reported on 04/20/2020)   TRESIBA FLEXTOUCH 100 UNIT/ML FlexTouch Pen INJECT 60 UNITS SUBCUTANEOUSLY AT BEDTIME   valACYclovir (VALTREX) 500 MG tablet Take 1 tablet (500 mg total) by mouth daily.   [DISCONTINUED] glipiZIDE (GLUCOTROL XL) 5 MG 24 hr tablet Take 1 tablet by mouth once daily with breakfast   [DISCONTINUED] metFORMIN (GLUCOPHAGE-XR) 500 MG 24 hr tablet Take 1 tablet by mouth once daily with breakfast   No facility-administered encounter medications on file as  of 09/18/2020.    ALLERGIES: No Known Allergies  VACCINATION STATUS: Immunization History  Administered Date(s) Administered   Influenza,inj,Quad PF,6+ Mos 12/02/2019   PFIZER(Purple Top)SARS-COV-2 Vaccination 05/03/2019, 05/28/2019, 12/17/2019    Diabetes She presents for her follow-up diabetic visit. She has type 2 diabetes mellitus. Onset time: She was diagnosed at approximate age of. Her disease course has been worsening. There are no hypoglycemic associated symptoms. Pertinent negatives for hypoglycemia include no confusion, headaches, pallor or seizures. Associated symptoms include fatigue, foot paresthesias and polydipsia. Pertinent negatives for diabetes include no polyphagia, no polyuria and no weight loss. There are no hypoglycemic complications. Symptoms are improving. There are no diabetic complications. Risk factors for coronary artery disease include diabetes mellitus, family history and sedentary lifestyle. Current diabetic treatment includes oral agent (dual therapy) and insulin injections. She is compliant with treatment most of the time. Her weight is fluctuating minimally. She is following a generally unhealthy diet. When asked about meal planning, she reported none. She has not had a previous visit with a dietitian. She participates in exercise intermittently. Her home blood glucose trend is decreasing steadily. Her overall blood glucose range is 140-180 mg/dl. (She presents today with her meter, no logs, showing improving glycemic profile, yet still above target.  Her POCT A1c today is 9.6%, increasing from last visit of 9.3%.  She reports she had COVID and was consuming lots of fruits, cough syrups, cough drops, etc which ran her sugars higher than usual.  She also reports she still struggles with the timing of her dinner as she gets off work late.  She denies any hypoglycemia. ) An ACE inhibitor/angiotensin II receptor blocker is not being taken. She does not see a podiatrist.Eye  exam is not current.  Hypertension This is a chronic problem. The current episode started more than 1 month ago. The problem has been gradually worsening since onset. The problem is uncontrolled. Pertinent negatives include no headaches. There are no associated agents to hypertension. Risk factors for coronary artery disease include diabetes mellitus, dyslipidemia, sedentary lifestyle and stress. Past treatments include nothing. Compliance problems include diet and exercise.    Review of systems  Constitutional: + Minimally fluctuating body weight,  current Body mass index is 29.21 kg/m. , + fatigue, no subjective hyperthermia, no subjective hypothermia Eyes: no blurry vision, no xerophthalmia ENT: no sore throat, no nodules palpated in throat, no dysphagia/odynophagia, no hoarseness Cardiovascular: no chest  pain, no shortness of breath, no palpitations, occasional leg swelling Respiratory: no cough, no shortness of breath Gastrointestinal: no nausea/vomiting/diarrhea Musculoskeletal: no muscle/joint aches Skin: no rashes, no hyperemia Neurological: no tremors, no dizziness, + numbness/tingling in bilateral feet Psychiatric: no depression, no anxiety   Objective:    BP 137/82   Pulse 90   Ht 5' 6"  (1.676 m)   Wt 181 lb (82.1 kg)   BMI 29.21 kg/m   Wt Readings from Last 3 Encounters:  09/18/20 181 lb (82.1 kg)  04/20/20 183 lb (83 kg)  04/14/20 182 lb 12.8 oz (82.9 kg)     BP Readings from Last 3 Encounters:  09/18/20 137/82  04/20/20 (!) 148/96  04/14/20 139/82     Physical Exam- Limited  Constitutional:  Body mass index is 29.21 kg/m. , not in acute distress, normal state of mind Eyes:  EOMI, no exophthalmos Neck: Supple Cardiovascular: RRR, no murmurs, rubs, or gallops, no edema Respiratory: Adequate breathing efforts, no crackles, rales, rhonchi, or wheezing Musculoskeletal: no gross deformities, strength intact in all four extremities, no gross restriction of joint  movements Skin:  no rashes, no hyperemia Neurological: no tremor with outstretched hands   CMP ( most recent) CMP     Component Value Date/Time   NA 133 (L) 08/28/2020 1416   K 4.6 08/28/2020 1416   CL 98 08/28/2020 1416   CO2 19 (L) 08/28/2020 1416   GLUCOSE 220 (H) 08/28/2020 1416   GLUCOSE 89 11/13/2019 1017   BUN 16 08/28/2020 1416   CREATININE 0.84 08/28/2020 1416   CREATININE 0.83 11/13/2019 1017   CALCIUM 9.7 08/28/2020 1416   PROT 7.5 08/28/2020 1416   ALBUMIN 4.7 08/28/2020 1416   AST 12 08/28/2020 1416   ALT 9 08/28/2020 1416   ALKPHOS 82 08/28/2020 1416   BILITOT 0.2 08/28/2020 1416   GFRNONAA 85 11/13/2019 1017   GFRAA 99 11/13/2019 1017     Diabetic Labs (most recent): Lab Results  Component Value Date   HGBA1C 9.6 (A) 09/18/2020   HGBA1C 9.3 (A) 03/27/2020   HGBA1C 7.2 (A) 11/14/2019     Assessment & Plan:   1) Uncontrolled type 2 diabetes  - Brandi Oneill has currently uncontrolled symptomatic type 2 DM since  45 years of age.  She presents today with her meter, no logs, showing improving glycemic profile, yet still above target.  Her POCT A1c today is 9.6%, increasing from last visit of 9.3%.  She reports she had COVID and was consuming lots of fruits, cough syrups, cough drops, etc which ran her sugars higher than usual.  She also reports she still struggles with the timing of her dinner as she gets off work late.  She denies any hypoglycemia.  Analysis of her meter shows 7-day average of 140, 14-day average of 159, 30-day average of 173.  - Recent labs reviewed.   - I had a long discussion with her about the progressive nature of diabetes and the pathology behind its complications.  -She does not report gross complications, however she remains at a high risk for more acute and chronic complications which include CAD, CVA, CKD, retinopathy, and neuropathy. These are all discussed in detail with her.  - Nutritional counseling repeated at each  appointment due to patients tendency to fall back in to old habits.  - The patient admits there is a room for improvement in their diet and drink choices. -  Suggestion is made for the patient to avoid simple carbohydrates  from their diet including Cakes, Sweet Desserts / Pastries, Ice Cream, Soda (diet and regular), Sweet Tea, Candies, Chips, Cookies, Sweet Pastries, Store Bought Juices, Alcohol in Excess of 1-2 drinks a day, Artificial Sweeteners, Coffee Creamer, and "Sugar-free" Products. This will help patient to have stable blood glucose profile and potentially avoid unintended weight gain.   - I encouraged the patient to switch to unprocessed or minimally processed complex starch and increased protein intake (animal or plant source), fruits, and vegetables.   - Patient is advised to stick to a routine mealtimes to eat 3 meals a day and avoid unnecessary snacks (to snack only to correct hypoglycemia).  - I have approached her with the following individualized plan to manage  her diabetes and patient agrees:   -Based on her recently improved glycemic profile, she is advised to continue her current regimen. She is advised to continue Tresiba 60 units SQ nightly, continue Metformin 500 mg ER daily with breakfast and Glipizide 5 mg XL daily with breakfast.  -She is encouraged to start monitoring blood glucose at least twice daily, before breakfast and before bed, and call the clinic if she has readings less than 70 or greater than 200 for 3 tests in a row.  - Specific targets for  A1c;  LDL, HDL,  and Triglycerides were discussed with the patient.  2) Blood Pressure /Hypertension:  Her blood pressure is controlled to target.  She is not currently on any antihypertensive medications at this time.  She may benefit from addition of low dose ACE/ARB on subsequent visits for renal protection from diabetes if her BP remains elevated on 3 separate visits.  3) Lipids/Hyperlipidemia:  Her most recent  lipid panel from 11/13/19 shows controlled LDL of 77.  She is not on any cholesterol lowering medications at this time.  She may benefit from addition of statin medication in the future.  4)  Weight/Diet:  Her Body mass index is 29.21 kg/m. -  she is a candidate for some weight loss.  I discussed with her the fact that loss of 5 - 10% of her  current body weight will have the most impact on her diabetes management.  Exercise, and detailed carbohydrates information provided  -  detailed on discharge instructions.  5) Vitamin D Deficiency: Her most recent vitamin D level from 08/28/20 was 13.2.  She is not currently on any supplementation.  I discussed and initiated replenishment therapy with Ergocalciferol 50000 units weekly x 12 weeks.  6) Chronic Care/Health Maintenance: -she is not on ACEI/ARB or Statin medications and is encouraged to initiate and continue to follow up with Ophthalmology, Dentist,  Podiatrist at least yearly or according to recommendations, and advised to  stay away from smoking. I have recommended yearly flu vaccine and pneumonia vaccine at least every 5 years; moderate intensity exercise for up to 150 minutes weekly; and  sleep for at least 7 hours a day.  - she is advised to maintain close follow up with Lindell Spar, MD for primary care needs, as well as her other providers for optimal and coordinated care.    I spent 30 minutes in the care of the patient today including review of labs from [facility], Lipids, Thyroid Function, Hematology (current and previous including abstractions from other facilities); face-to-face time discussing  her blood glucose readings/logs, discussing hypoglycemia and hyperglycemia episodes and symptoms, medications doses, her options of short and long term treatment based on the latest standards of care / guidelines;  discussion about  incorporating lifestyle medicine;  and documenting the encounter.    Please refer to Patient Instructions for Blood  Glucose Monitoring and Insulin/Medications Dosing Guide"  in media tab for additional information. Please  also refer to " Patient Self Inventory" in the Media  tab for reviewed elements of pertinent patient history.  Brandi Oneill participated in the discussions, expressed understanding, and voiced agreement with the above plans.  All questions were answered to her satisfaction. she is encouraged to contact clinic should she have any questions or concerns prior to her return visit.   Follow up plan: - Return in about 3 months (around 12/19/2020) for Diabetes F/U with A1c in office, No previsit labs, Bring meter and logs.  Rayetta Pigg, [facility] [address] [place], [place] [address] Phone: [phone] Fax: [phone]  09/18/2020, 8:53 AM

## 2020-10-29 ENCOUNTER — Other Ambulatory Visit: Payer: Self-pay | Admitting: Nurse Practitioner

## 2020-12-23 ENCOUNTER — Ambulatory Visit (INDEPENDENT_AMBULATORY_CARE_PROVIDER_SITE_OTHER): Payer: PRIVATE HEALTH INSURANCE | Admitting: Nurse Practitioner

## 2020-12-23 ENCOUNTER — Encounter: Payer: Self-pay | Admitting: Nurse Practitioner

## 2020-12-23 VITALS — BP 118/79 | HR 93 | Ht 66.0 in | Wt 164.0 lb

## 2020-12-23 DIAGNOSIS — E559 Vitamin D deficiency, unspecified: Secondary | ICD-10-CM

## 2020-12-23 DIAGNOSIS — I10 Essential (primary) hypertension: Secondary | ICD-10-CM | POA: Diagnosis not present

## 2020-12-23 DIAGNOSIS — E118 Type 2 diabetes mellitus with unspecified complications: Secondary | ICD-10-CM | POA: Diagnosis not present

## 2020-12-23 DIAGNOSIS — Z794 Long term (current) use of insulin: Secondary | ICD-10-CM

## 2020-12-23 LAB — POCT GLYCOSYLATED HEMOGLOBIN (HGB A1C): HbA1c, POC (controlled diabetic range): 12.2 % — AB (ref 0.0–7.0)

## 2020-12-23 MED ORDER — INSULIN DETEMIR 100 UNIT/ML FLEXPEN: 40.0000 [IU] | PEN_INJECTOR | Freq: Every day | SUBCUTANEOUS | 3 refills | Status: DC

## 2020-12-23 MED ORDER — METFORMIN HCL ER 500 MG PO TB24: 500.0000 mg | ORAL_TABLET | Freq: Every day | ORAL | 3 refills | Status: DC

## 2020-12-23 MED ORDER — GLIPIZIDE ER 5 MG PO TB24: 5.0000 mg | ORAL_TABLET | Freq: Every day | ORAL | 3 refills | Status: DC

## 2020-12-23 NOTE — Patient Instructions (Signed)

## 2020-12-23 NOTE — Progress Notes (Signed)
12/23/2020, 4:38 PM         Endocrinology follow-up note   Subjective:    Patient ID: Brandi Oneill, female    DOB: 1975-12-15.  Brandi Oneill is being seen in follow-up for management of currently uncontrolled symptomatic diabetes requested by  Lindell Spar, MD.   Past Medical History:  Diagnosis Date   Allergy    Diabetes mellitus without complication [facility])     Past Surgical History:  Procedure Laterality Date   ABLATION     TUBAL LIGATION      Social History   Socioeconomic History   Marital status: Single    Spouse name: Not on file   Number of children: Not on file   Years of education: Not on file   Highest education level: Not on file  Occupational History   Occupation: shipping & receiving  Tobacco Use   Smoking status: Never   Smokeless tobacco: Never  Vaping Use   Vaping Use: Never used  Substance and Sexual Activity   Alcohol use: Yes   Drug use: Not Currently   Sexual activity: Yes    Birth control/protection: Surgical    Comment: tubal  Other Topics Concern   Not on file  Social History Narrative   Not on file   Social Determinants of Health   Financial Resource Strain: Medium Risk   Difficulty of Paying Living Expenses: Somewhat hard  Food Insecurity: Food Insecurity Present   Worried About Charity fundraiser in the Last Year: Sometimes true   Ran Out of Food in the Last Year: Sometimes true  Transportation Needs: Unmet Transportation Needs   Lack of Transportation (Medical): No   Lack of Transportation (Non-Medical): Yes  Physical Activity: Inactive   Days of Exercise per Week: 0 days   Minutes of Exercise per Session: 0 min  Stress: Stress Concern Present   Feeling of Stress : To some extent  Social Connections: Moderately Isolated   Frequency of Communication with Friends and Family: Twice a week   Frequency of Social Gatherings with Friends  and Family: Once a week   Attends Religious Services: Never   Marine scientist or Organizations: No   Attends Music therapist: Never   Marital Status: Living with partner    Family History  Problem Relation Age of Onset   Diabetes Mother    Hypertension Mother    Diabetes Father    Hypertension Father    Gout Brother     Outpatient Encounter Medications as of 12/23/2020  Medication Sig   blood glucose meter kit and supplies Check blood glucose fasting, before lunch, before dinner and at bedtime   gabapentin (NEURONTIN) 100 MG capsule TAKE 1 CAPSULE(100 MG) BY MOUTH TWICE DAILY   ibuprofen (ADVIL) 600 MG tablet Take 1 tablet (600 mg total) by mouth 2 (two) times daily as needed for moderate pain.   insulin detemir (LEVEMIR) 100 UNIT/ML FlexPen Inject 40 Units into the skin at bedtime.   Insulin Pen Needle (B-D ULTRAFINE III SHORT PEN) 31G X 8 MM MISC 1 each by Does not apply  route 4 (four) times daily.   meloxicam (MOBIC) 15 MG tablet Take 15 mg by mouth daily.   ONETOUCH ULTRA test strip 1 each by Other route 2 (two) times daily. DX E11.8   valACYclovir (VALTREX) 500 MG tablet Take 1 tablet (500 mg total) by mouth daily.   [DISCONTINUED] glipiZIDE (GLUCOTROL XL) 5 MG 24 hr tablet Take 1 tablet (5 mg total) by mouth daily with breakfast.   [DISCONTINUED] metFORMIN (GLUCOPHAGE-XR) 500 MG 24 hr tablet Take 1 tablet (500 mg total) by mouth daily with breakfast.   glipiZIDE (GLUCOTROL XL) 5 MG 24 hr tablet Take 1 tablet (5 mg total) by mouth daily with breakfast.   metFORMIN (GLUCOPHAGE-XR) 500 MG 24 hr tablet Take 1 tablet (500 mg total) by mouth daily with breakfast.   Vitamin D, Ergocalciferol, (DRISDOL) 1.25 MG (50000 UNIT) CAPS capsule Take 1 capsule (50,000 Units total) by mouth every 7 (seven) days. (Patient not taking: Reported on 12/23/2020)   [DISCONTINUED] terbinafine (LAMISIL) 250 MG tablet Take 1 tablet (250 mg total) by mouth daily. (Patient not taking:  Reported on 04/20/2020)   [DISCONTINUED] TRESIBA FLEXTOUCH 100 UNIT/ML FlexTouch Pen INJECT 60 UNITS SUBCUTANEOUSLY AT BEDTIME (Patient not taking: Reported on 12/23/2020)   No facility-administered encounter medications on file as of 12/23/2020.    ALLERGIES: No Known Allergies  VACCINATION STATUS: Immunization History  Administered Date(s) Administered   Influenza,inj,Quad PF,6+ Mos 12/02/2019   PFIZER(Purple Top)SARS-COV-2 Vaccination 05/03/2019, 05/28/2019, 12/17/2019    Diabetes She presents for her follow-up diabetic visit. She has type 2 diabetes mellitus. Onset time: She was diagnosed at approximate age of. Her disease course has been worsening. There are no hypoglycemic associated symptoms. Pertinent negatives for hypoglycemia include no confusion, headaches, pallor or seizures. Associated symptoms include fatigue, foot paresthesias, polydipsia, polyuria and weight loss. Pertinent negatives for diabetes include no polyphagia. There are no hypoglycemic complications. Symptoms are worsening. There are no diabetic complications. Risk factors for coronary artery disease include diabetes mellitus, family history and sedentary lifestyle. Current diabetic treatment includes oral agent (dual therapy) and insulin injections. She is compliant with treatment some of the time (was not able to afford her insulin- has been off it for 2 months approx). Her weight is fluctuating minimally. She is following a generally unhealthy diet. When asked about meal planning, she reported none. She has not had a previous visit with a dietitian. She participates in exercise intermittently. Her home blood glucose trend is increasing rapidly. Her overall blood glucose range is 140-180 mg/dl. (She presents today with her meter, no logs, showing inconsistent glucose monitoring pattern.  She has not been monitoring her glucose recently due to the high numbers she is having which is stressing her out.  Her POCT A1c today is  12.2%, increasing from last visit of 9.6%.  She ran out of insulin approx 2 months ago and cannot afford the copay for the Tresiba.  She is still taking her Metformin and Glipizide daily. ) An ACE inhibitor/angiotensin II receptor blocker is not being taken. She does not see a podiatrist.Eye exam is not current.  Hypertension This is a chronic problem. The current episode started more than 1 month ago. The problem has been gradually worsening since onset. The problem is uncontrolled. Pertinent negatives include no headaches. There are no associated agents to hypertension. Risk factors for coronary artery disease include diabetes mellitus, dyslipidemia, sedentary lifestyle and stress. Past treatments include nothing. Compliance problems include diet and exercise.    Review of systems  Constitutional: + drastically decreasing body weight,  current Body mass index is 26.47 kg/m. , + fatigue, no subjective hyperthermia, no subjective hypothermia Eyes: no blurry vision, no xerophthalmia ENT: no sore throat, no nodules palpated in throat, no dysphagia/odynophagia, no hoarseness Cardiovascular: no chest pain, no shortness of breath, no palpitations, occasional leg swelling Respiratory: no cough, no shortness of breath Gastrointestinal: no nausea/vomiting/diarrhea Musculoskeletal: no muscle/joint aches Skin: no rashes, no hyperemia Neurological: no tremors, no dizziness, + numbness/tingling in bilateral feet Psychiatric: no depression, no anxiety   Objective:    BP 118/79   Pulse 93   Ht 5' 6"  (1.676 m)   Wt 164 lb (74.4 kg)   BMI 26.47 kg/m   Wt Readings from Last 3 Encounters:  12/23/20 164 lb (74.4 kg)  09/18/20 181 lb (82.1 kg)  04/20/20 183 lb (83 kg)     BP Readings from Last 3 Encounters:  12/23/20 118/79  09/18/20 137/82  04/20/20 (!) 148/96     Physical Exam- Limited  Constitutional:  Body mass index is 26.47 kg/m. , not in acute distress, normal state of mind Eyes:   EOMI, no exophthalmos Neck: Supple Cardiovascular: RRR, no murmurs, rubs, or gallops, no edema Respiratory: Adequate breathing efforts, no crackles, rales, rhonchi, or wheezing Musculoskeletal: no gross deformities, strength intact in all four extremities, no gross restriction of joint movements Skin:  no rashes, no hyperemia Neurological: no tremor with outstretched hands   CMP ( most recent) CMP     Component Value Date/Time   NA 133 (L) 08/28/2020 1416   K 4.6 08/28/2020 1416   CL 98 08/28/2020 1416   CO2 19 (L) 08/28/2020 1416   GLUCOSE 220 (H) 08/28/2020 1416   GLUCOSE 89 11/13/2019 1017   BUN 16 08/28/2020 1416   CREATININE 0.84 08/28/2020 1416   CREATININE 0.83 11/13/2019 1017   CALCIUM 9.7 08/28/2020 1416   PROT 7.5 08/28/2020 1416   ALBUMIN 4.7 08/28/2020 1416   AST 12 08/28/2020 1416   ALT 9 08/28/2020 1416   ALKPHOS 82 08/28/2020 1416   BILITOT 0.2 08/28/2020 1416   GFRNONAA 85 11/13/2019 1017   GFRAA 99 11/13/2019 1017     Diabetic Labs (most recent): Lab Results  Component Value Date   HGBA1C 12.2 (A) 12/23/2020   HGBA1C 9.6 (A) 09/18/2020   HGBA1C 9.3 (A) 03/27/2020     Assessment & Plan:   1) Uncontrolled type 2 diabetes  - AUDREANA HANCOX has currently uncontrolled symptomatic type 2 DM since  45 years of age.  She presents today with her meter, no logs, showing inconsistent glucose monitoring pattern.  She has not been monitoring her glucose recently due to the high numbers she is having which is stressing her out.  Her POCT A1c today is 12.2%, increasing from last visit of 9.6%.  She ran out of insulin approx 2 months ago and cannot afford the copay for the Tresiba.  She is still taking her Metformin and Glipizide daily.  - Recent labs reviewed.   - I had a long discussion with her about the progressive nature of diabetes and the pathology behind its complications.  -She does not report gross complications, however she remains at a high risk  for more acute and chronic complications which include CAD, CVA, CKD, retinopathy, and neuropathy. These are all discussed in detail with her.  - Nutritional counseling repeated at each appointment due to patients tendency to fall back in to old habits.  - The patient admits  there is a room for improvement in their diet and drink choices. -  Suggestion is made for the patient to avoid simple carbohydrates from their diet including Cakes, Sweet Desserts / Pastries, Ice Cream, Soda (diet and regular), Sweet Tea, Candies, Chips, Cookies, Sweet Pastries, Store Bought Juices, Alcohol in Excess of 1-2 drinks a day, Artificial Sweeteners, Coffee Creamer, and "Sugar-free" Products. This will help patient to have stable blood glucose profile and potentially avoid unintended weight gain.   - I encouraged the patient to switch to unprocessed or minimally processed complex starch and increased protein intake (animal or plant source), fruits, and vegetables.   - Patient is advised to stick to a routine mealtimes to eat 3 meals a day and avoid unnecessary snacks (to snack only to correct hypoglycemia).  - I have approached her with the following individualized plan to manage  her diabetes and patient agrees:   -She is advised to restart her basal insulin (preferred this over premixed insulin due to simplicity) but will change to Levemir to see if it is cheaper option for her.  She is advised to start Levemir 40 units SQ nightly, continue Metformin 500 mg ER daily with breakfast and Glipizide 5 mg XL daily with breakfast.   -She is encouraged to start monitoring blood glucose at least twice daily, before breakfast and before bed, and call the clinic if she has readings less than 70 or greater than 200 for 3 tests in a row.  - Specific targets for  A1c;  LDL, HDL,  and Triglycerides were discussed with the patient.  2) Blood Pressure /Hypertension:  Her blood pressure is controlled to target.  She is not  currently on any antihypertensive medications at this time.  She may benefit from addition of low dose ACE/ARB on subsequent visits for renal protection from diabetes if her BP remains elevated on 3 separate visits.  3) Lipids/Hyperlipidemia:  Her most recent lipid panel from 11/13/19 shows controlled LDL of 77.  She is not on any cholesterol lowering medications at this time.  She may benefit from addition of statin medication in the future.  Will recheck lipid panel prior to next visit.  4)  Weight/Diet:  Her Body mass index is 26.47 kg/m. -  she is a candidate for some weight loss.  I discussed with her the fact that loss of 5 - 10% of her  current body weight will have the most impact on her diabetes management.  Exercise, and detailed carbohydrates information provided  -  detailed on discharge instructions.  5) Vitamin D Deficiency: Her most recent vitamin D level from 08/28/20 was 13.2.  She has finished her replacement with Ergocalciferol 50000 units weekly.  Will recheck vitamin D level prior to next visit.  6) Chronic Care/Health Maintenance: -she is not on ACEI/ARB or Statin medications and is encouraged to initiate and continue to follow up with Ophthalmology, Dentist,  Podiatrist at least yearly or according to recommendations, and advised to  stay away from smoking. I have recommended yearly flu vaccine and pneumonia vaccine at least every 5 years; moderate intensity exercise for up to 150 minutes weekly; and  sleep for at least 7 hours a day.  - she is advised to maintain close follow up with Lindell Spar, MD for primary care needs, as well as her other providers for optimal and coordinated care.     I spent 36 minutes in the care of the patient today including review of labs from [place],  Lipids, Thyroid Function, Hematology (current and previous including abstractions from other facilities); face-to-face time discussing  her blood glucose readings/logs, discussing hypoglycemia and  hyperglycemia episodes and symptoms, medications doses, her options of short and long term treatment based on the latest standards of care / guidelines;  discussion about incorporating lifestyle medicine;  and documenting the encounter.    Please refer to Patient Instructions for Blood Glucose Monitoring and Insulin/Medications Dosing Guide"  in media tab for additional information. Please  also refer to " Patient Self Inventory" in the Media  tab for reviewed elements of pertinent patient history.  Lonna Duval participated in the discussions, expressed understanding, and voiced agreement with the above plans.  All questions were answered to her satisfaction. she is encouraged to contact clinic should she have any questions or concerns prior to her return visit.   Follow up plan: - Return in about 3 months (around 03/25/2021) for Diabetes F/U- A1c and UM in office, Previsit labs, Bring meter and logs.  Rayetta Pigg, [facility] [address] [place], [place] [address] Phone: [phone] Fax: [phone]  12/23/2020, 4:38 PM

## 2020-12-28 ENCOUNTER — Telehealth: Payer: Self-pay | Admitting: Nurse Practitioner

## 2020-12-28 NOTE — Telephone Encounter (Signed)
Pt stated the levemir cost more than the tresiba.

## 2020-12-28 NOTE — Telephone Encounter (Signed)
Can you find out why?  Was it the cost?

## 2020-12-28 NOTE — Telephone Encounter (Signed)
Patient said she was suppose to let you know if she was unable to get her insulin. She was not able to get that. Please Advise

## 2020-12-29 NOTE — Telephone Encounter (Signed)
Discussed with pt, she stated she would contact her insurance company and will let us know what they prefer. Sample of Tresiba 200units/ml given to pt.

## 2020-12-29 NOTE — Telephone Encounter (Signed)
Brandi Oneill, I told her that may happen.  She needs to call her insurance company and see which is the most affordable for her and I will be happy to change it.  In the meantime, she can come here and pick up some samples so she doesn't run out completely.

## 2021-03-17 ENCOUNTER — Emergency Department (HOSPITAL_COMMUNITY): Payer: PRIVATE HEALTH INSURANCE

## 2021-03-17 ENCOUNTER — Other Ambulatory Visit: Payer: Self-pay

## 2021-03-17 ENCOUNTER — Emergency Department (HOSPITAL_COMMUNITY)
Admission: EM | Admit: 2021-03-17 | Discharge: 2021-03-17 | Disposition: A | Discharge status: Home / Self Care | Payer: PRIVATE HEALTH INSURANCE | Attending: Emergency Medicine | Admitting: Emergency Medicine

## 2021-03-17 ENCOUNTER — Encounter (HOSPITAL_COMMUNITY): Payer: Self-pay

## 2021-03-17 DIAGNOSIS — M25562 Pain in left knee: Secondary | ICD-10-CM | POA: Insufficient documentation

## 2021-03-17 DIAGNOSIS — Z79899 Other long term (current) drug therapy: Secondary | ICD-10-CM | POA: Insufficient documentation

## 2021-03-17 DIAGNOSIS — Z794 Long term (current) use of insulin: Secondary | ICD-10-CM | POA: Insufficient documentation

## 2021-03-17 NOTE — ED Provider Notes (Signed)
[facility] EMERGENCY DEPARTMENT Provider Note   CSN: [id number] Arrival date & time: 03/17/21  1020     History  Chief Complaint  Patient presents with   Knee Pain    Brandi Oneill is a 46 y.o. female who presents to the ED complaining of left knee pain onset 2 days ago. She was going up steps when she twisted her left knee.  She has pain with weight bearing on her left leg. Pt tried Advil at home with relief for her symptoms.  Denies color change, wound, ankle pain.  The history is provided by the patient. No language interpreter was used.      Home Medications Prior to Admission medications   Medication Sig Start Date End Date Taking? Authorizing Provider  blood glucose meter kit and supplies Check blood glucose fasting, before lunch, before dinner and at bedtime 12/13/18   Corum, Rex Kras, MD  gabapentin (NEURONTIN) 100 MG capsule TAKE 1 CAPSULE(100 MG) BY MOUTH TWICE DAILY 07/09/20   Lindell Spar, MD  glipiZIDE (GLUCOTROL XL) 5 MG 24 hr tablet Take 1 tablet (5 mg total) by mouth daily with breakfast. 12/23/20   Brita Romp, NP  ibuprofen (ADVIL) 600 MG tablet Take 1 tablet (600 mg total) by mouth 2 (two) times daily as needed for moderate pain. 03/03/20   Lindell Spar, MD  insulin detemir (LEVEMIR) 100 UNIT/ML FlexPen Inject 40 Units into the skin at bedtime. 12/23/20   Brita Romp, NP  Insulin Pen Needle (B-D ULTRAFINE III SHORT PEN) 31G X 8 MM MISC 1 each by Does not apply route 4 (four) times daily. 02/21/19   Cassandria Anger, MD  meloxicam (MOBIC) 15 MG tablet Take 15 mg by mouth daily. 03/10/20   [provider]  metFORMIN (GLUCOPHAGE-XR) 500 MG 24 hr tablet Take 1 tablet (500 mg total) by mouth daily with breakfast. 12/23/20   Brita Romp, NP  [facility] ULTRA test strip 1 each by Other route 2 (two) times daily. DX E11.8 01/01/20   Brita Romp, NP  valACYclovir (VALTREX) 500 MG tablet Take 1 tablet (500 mg total) by mouth daily.  03/20/20   Lindell Spar, MD  Vitamin D, Ergocalciferol, (DRISDOL) 1.25 MG (50000 UNIT) CAPS capsule Take 1 capsule (50,000 Units total) by mouth every 7 (seven) days. Patient not taking: Reported on 12/23/2020 09/18/20   Brita Romp, NP      Allergies    Patient has no known allergies.    Review of Systems   Review of Systems  Constitutional:  Negative for chills and fever.  Musculoskeletal:  Positive for arthralgias. Negative for joint swelling.  Skin:  Negative for color change, rash and wound.  All other systems reviewed and are negative.  Physical Exam Updated Vital Signs BP 130/82 (BP Location: Right Arm)    Pulse 88    Temp 98.6 F (37 C)    Resp 20    Ht 5' 6"  (1.676 m)    Wt 74.8 kg    SpO2 100%    BMI 26.63 kg/m  Physical Exam Vitals and nursing note reviewed.  Constitutional:      General: She is not in acute distress.    Appearance: Normal appearance.  Eyes:     General: No scleral icterus.    Extraocular Movements: Extraocular movements intact.  Cardiovascular:     Rate and Rhythm: Normal rate.  Pulmonary:     Effort: Pulmonary effort is normal. No  respiratory distress.  Musculoskeletal:     Cervical back: Neck supple.     Right knee: Normal.     Left knee: No swelling, deformity, effusion, erythema, ecchymosis, lacerations, bony tenderness or crepitus. Normal range of motion. Tenderness present over the medial joint line.     Right lower leg: Normal.     Left lower leg: Normal.     Right ankle: Normal.     Left ankle: Normal.     Comments: Tenderness to palpation noted to medial aspect of left knee.  No obvious deformity, crepitus, ecchymosis, effusion.  Able to flex and extend knee with resistance with minimal tenderness to medial aspect.  No tenderness to palpation noted to left shin, left ankle, foot.  Pedal pulses intact bilaterally.  Sensation intact and strength intact.  No increased warmth to left knee.  Skin:    General: Skin is warm and dry.      Findings: No bruising, erythema or rash.  Neurological:     Mental Status: She is alert.  Psychiatric:        Behavior: Behavior normal.    ED Results / Procedures / Treatments   Labs (all labs ordered are listed, but only abnormal results are displayed) Labs Reviewed - No data to display  EKG None  Radiology DG Knee 2 Views Left  Result Date: 03/17/2021 CLINICAL DATA:  Left knee pain for several days. EXAM: LEFT KNEE - 1-2 VIEW COMPARISON:  None. FINDINGS: There is no joint effusion. No signs of acute fracture or dislocation. Soft tissues are unremarkable. No significant degenerative change. IMPRESSION: 1. No acute findings. Electronically Signed   By: Kerby Moors M.D.   On: 03/17/2021 13:30    Procedures Procedures    Medications Ordered in ED Medications - No data to display  ED Course/ Medical Decision Making/ A&P Clinical Course as of 03/17/21 1650  Wed Mar 17, 2021  1410 Reevaluated prior to discharge and discussed imaging findings.  Discussed discharge treatment plan.  Patient agreeable at this time.  Patient appears safe for discharge. [SB]    Clinical Course User Index [SB] Taira Knabe A, PA-C                           Medical Decision Making Amount and/or Complexity of Data Reviewed Radiology: ordered.   Patient with left knee pain onset 2 days ago s/p twisting her left knee while going up stairs. On exam, patient with Mild tenderness to palpation to medial aspect of left knee. No edema, erythema, effusion. Sensation and pulses intact bilaterally to lower extremities. Able to flex and extend left knee without difficulty. Differential diagnosis includes fracture, dislocation, septic arthritis, osteoarthritis, strain.   Imaging: I ordered imaging studies including left knee x-ray I independently visualized and interpreted imaging which showed no acute fracture or dislocation I agree with the radiologist interpretation  Disposition: Patient presentation  suspicious for strain of left knee.  Doubt fracture or dislocation.  Doubt osteoarthritis as no indications on imaging findings.  Doubt septic arthritis, patient afebrile, no increased warmth to the joint, able to flex and extend knee without difficulty.  After consideration of the diagnostic results and the patients response to treatment, I feel that the patient would benefit from discharge home with knee immobilizer and crutches.  Will provide information for on-call orthopedist to call if patient's symptoms do not resolve within 1.5-2 weeks.  Supportive care measures and strict return precautions discussed with patient  at bedside. Pt acknowledges and verbalizes understanding. Pt appears safe for discharge. Follow up as indicated in discharge paperwork.   This chart was dictated using voice recognition software, Dragon. Despite the best efforts of this provider to proofread and correct errors, errors may still occur which can change documentation meaning.   Final Clinical Impression(s) / ED Diagnoses Final diagnoses:  Acute pain of left knee    Rx / DC Orders ED Discharge Orders     None         Nomar Broad A, PA-C 03/17/21 1650    Sherwood Gambler, MD 03/19/21 2104

## 2021-03-17 NOTE — Discharge Instructions (Signed)
It was a pleasure taking care of you!   Your x-ray was negative for fracture or dislocation.  You may take over-the-counter 600 mg ibuprofen every 6 hours or 500 mg Tylenol every 6 hours as needed for pain for no more than 7 days.  You may apply ice to the affected area for up to 15 minutes at a time.  Ensure to place a barrier between your skin and the ice.  You will receive a knee immobilizer, wear as needed throughout the day.  You may remove the knee immobilizer at night.  Utilize the crutches to aid with walking.  You may follow-up with your primary care provider as needed.  Attached is information for the on-call orthopedist, call and set up a follow-up appointment if your symptoms are not improving in the next 1.5-2 weeks. Return to the Emergency Department if you are experiencing increasing/worsening pain, swelling, color change, fever, or worsening symptoms.

## 2021-03-17 NOTE — ED Triage Notes (Signed)
Patient complaining of left knee pain 2 days prior twisted her knee while walking up steps. Hurts to put weight on her left leg.

## 2021-03-25 ENCOUNTER — Ambulatory Visit: Payer: PRIVATE HEALTH INSURANCE | Admitting: Nurse Practitioner

## 2021-03-26 LAB — COMPREHENSIVE METABOLIC PANEL
ALT: 11 IU/L (ref 0–32)
AST: 8 IU/L (ref 0–40)
Albumin/Globulin Ratio: 1.7 (ref 1.2–2.2)
Albumin: 4.3 g/dL (ref 3.8–4.8)
Alkaline Phosphatase: 80 IU/L (ref 44–121)
BUN/Creatinine Ratio: 22 (ref 9–23)
BUN: 18 mg/dL (ref 6–24)
Bilirubin Total: <0.2 mg/dL (ref 0.0–1.2)
CO2: 23 mmol/L (ref 20–29)
Calcium: 9.9 mg/dL (ref 8.7–10.2)
Chloride: 103 mmol/L (ref 96–106)
Creatinine, Ser: 0.83 mg/dL (ref 0.57–1.00)
Globulin, Total: 2.6 g/dL (ref 1.5–4.5)
Glucose: 114 mg/dL — ABNORMAL HIGH (ref 70–99)
Potassium: 4.3 mmol/L (ref 3.5–5.2)
Sodium: 139 mmol/L (ref 134–144)
Total Protein: 6.9 g/dL (ref 6.0–8.5)
eGFR: 87 mL/min/{1.73_m2} (ref >59)

## 2021-03-26 LAB — LIPID PANEL
Chol/HDL Ratio: 3.4 ratio (ref 0.0–4.4)
Cholesterol, Total: 151 mg/dL (ref 100–199)
HDL: 45 mg/dL (ref >39)
LDL Chol Calc (NIH): 88 mg/dL (ref 0–99)
Triglycerides: 97 mg/dL (ref 0–149)
VLDL Cholesterol Cal: 18 mg/dL (ref 5–40)

## 2021-03-26 LAB — T4, FREE: Free T4: 1.1 ng/dL (ref 0.82–1.77)

## 2021-03-26 LAB — VITAMIN D 25 HYDROXY (VIT D DEFICIENCY, FRACTURES): Vit D, 25-Hydroxy: 19.6 ng/mL — ABNORMAL LOW (ref 30.0–100.0)

## 2021-03-26 LAB — TSH: TSH: 1.51 u[IU]/mL (ref 0.450–4.500)

## 2021-04-01 ENCOUNTER — Ambulatory Visit: Payer: PRIVATE HEALTH INSURANCE | Admitting: Nurse Practitioner

## 2021-04-15 ENCOUNTER — Ambulatory Visit: Payer: PRIVATE HEALTH INSURANCE | Admitting: Nurse Practitioner

## 2021-04-22 ENCOUNTER — Other Ambulatory Visit: Payer: Self-pay

## 2021-04-22 ENCOUNTER — Ambulatory Visit (INDEPENDENT_AMBULATORY_CARE_PROVIDER_SITE_OTHER): Payer: PRIVATE HEALTH INSURANCE | Admitting: Nurse Practitioner

## 2021-04-22 ENCOUNTER — Encounter: Payer: Self-pay | Admitting: Nurse Practitioner

## 2021-04-22 VITALS — BP 137/80 | HR 82 | Ht 66.0 in | Wt 173.0 lb

## 2021-04-22 DIAGNOSIS — Z794 Long term (current) use of insulin: Secondary | ICD-10-CM | POA: Diagnosis not present

## 2021-04-22 DIAGNOSIS — I10 Essential (primary) hypertension: Secondary | ICD-10-CM

## 2021-04-22 DIAGNOSIS — E559 Vitamin D deficiency, unspecified: Secondary | ICD-10-CM | POA: Diagnosis not present

## 2021-04-22 DIAGNOSIS — E1165 Type 2 diabetes mellitus with hyperglycemia: Secondary | ICD-10-CM

## 2021-04-22 LAB — POCT GLYCOSYLATED HEMOGLOBIN (HGB A1C): HbA1c POC (<> result, manual entry): 10 % (ref 4.0–5.6)

## 2021-04-22 LAB — POCT UA - MICROALBUMIN
Albumin/Creatinine Ratio, Urine, POC: <30
Creatinine, POC: 300 mg/dL
Microalbumin Ur, POC: 30 mg/L

## 2021-04-22 MED ORDER — GLIPIZIDE ER 5 MG PO TB24: 5.0000 mg | ORAL_TABLET | Freq: Every day | ORAL | 3 refills | Status: AC

## 2021-04-22 MED ORDER — METFORMIN HCL ER 500 MG PO TB24
500.0000 mg | ORAL_TABLET | Freq: Every day | ORAL | 3 refills | Status: AC
Start: 2021-04-22 — End: ?

## 2021-04-22 MED ORDER — VITAMIN D (ERGOCALCIFEROL) 1.25 MG (50000 UNIT) PO CAPS: 50000.0000 [IU] | ORAL_CAPSULE | ORAL | 0 refills | Status: DC

## 2021-04-22 NOTE — Patient Instructions (Signed)
Diabetes Mellitus Emergency Preparedness Plan ?A diabetes emergency preparedness plan is a checklist to make sure you have everything you need to manage your diabetes in case of an emergency, such as an evacuation, natural disaster, national security emergency, or pandemic lockdown. ?Managing your diabetes is something you have to do all day every day. The American Diabetes Association and the American College of Endocrinology both recommend putting together an emergency diabetes kit. Your kit should include important information and documents as well as all the supplies you will need to manage your diabetes for at least 1 week. Store it in a portable, waterproof bag or container. The best time to start making your emergency kit is now. ?How to make your emergency kit ?Collect information and documents ?Include the following information and documents in your kit: ?The type of diabetes you have. ?A copy of your health insurance cards and photo ID. ?A list of all your other medical conditions, allergies, and surgeries. ?A list of all your medicines and doses with the contact information for your pharmacy. Ask your health care provider for a list of your current medicines. ?Any recent lab results, including your latest hemoglobin A1C (HbA1C). ?The make, model, and serial number of your insulin pump, if you use one. Also include contact information for the manufacturer. ?Contact information for people who should be notified in case of an emergency. Include your health care provider's name, address, and phone number. ?Collect diabetes care items ?Include the following diabetes care items in your kit: ?At least a 1-week supply of: ?Oral medicines. ?Insulin. ?Blood glucose testing supplies. These include testing strips, lancets, and extra batteries for your blood glucose monitor and pump. ?A charger for the continuous glucose monitor (CGM) receiver and pump. ?Any extra supplies needed for your CGM or pump. ?A supply of  glucagon, glucose tablets, juice, soda, or hard candy in case of hypoglycemia. ?Coolers or cold packs. ?A safe container for syringes, needles, and lancets. ? ?Other preparations ?Other things to consider doing as part of your emergency plan: ?Make sure that your mobile phone is charged and that you have an extra charger, cable, or batteries. ?Choose a meeting place for family members. ?Wear a medical alert or ID bracelet. ?If you have a child with diabetes, make sure your child's school has a copy of his or her emergency plan, including the name of the staff member who will assist your child. ?Where to find more information ?American Diabetes Association: www.diabetes.org ?Centers for Disease Control and Prevention: blogs.cdc.gov ?Summary ?A diabetes emergency preparedness plan is a checklist to make sure you have everything you need in case of an emergency. ?Your kit should include important information and documents as well as all the supplies you will need to manage your condition for at least 1 week. ?Store your kit in a portable, waterproof bag or container. ?The best time to start making your emergency kit is now. ?This information is not intended to replace advice given to you by your health care provider. Make sure you discuss any questions you have with your health care provider. ?Document Revised: 07/25/2019 Document Reviewed: 07/25/2019 ?Elsevier Patient Education ? 2022 Elsevier Inc. ? ?

## 2021-04-22 NOTE — Progress Notes (Signed)
? ?                                                          ?     04/22/2021, 12:16 PM ? ?       ?Endocrinology follow-up note ? ? ?Subjective:  ? ? Patient ID: Brandi Oneill, female    DOB: 04-02-1975.  ?Brandi Oneill is being seen in follow-up for management of currently uncontrolled symptomatic diabetes requested by  Lindell Spar, MD. ? ? ?Past Medical History:  ?Diagnosis Date  ? Allergy   ? Diabetes mellitus without complication ([place])   ? ? ?Past Surgical History:  ?Procedure Laterality Date  ? ABLATION    ? TUBAL LIGATION    ? ? ?Social History  ? ?Socioeconomic History  ? Marital status: Significant Other  ?  Spouse name: Not on file  ? Number of children: Not on file  ? Years of education: Not on file  ? Highest education level: Not on file  ?Occupational History  ? Occupation: shipping & receiving  ?Tobacco Use  ? Smoking status: Never  ? Smokeless tobacco: Never  ?Vaping Use  ? Vaping Use: Never used  ?Substance and Sexual Activity  ? Alcohol use: Yes  ? Drug use: Not Currently  ? Sexual activity: Yes  ?  Birth control/protection: Surgical  ?  Comment: tubal  ?Other Topics Concern  ? Not on file  ?Social History Narrative  ? Not on file  ? ?Social Determinants of Health  ? ?Financial Resource Strain: Not on file  ?Food Insecurity: Not on file  ?Transportation Needs: Not on file  ?Physical Activity: Not on file  ?Stress: Not on file  ?Social Connections: Not on file  ? ? ?Family History  ?Problem Relation Age of Onset  ? Diabetes Mother   ? Hypertension Mother   ? Diabetes Father   ? Hypertension Father   ? Gout Brother   ? ? ?Outpatient Encounter Medications as of 04/22/2021  ?Medication Sig  ? blood glucose meter kit and supplies Check blood glucose fasting, before lunch, before dinner and at bedtime  ? gabapentin (NEURONTIN) 100 MG capsule TAKE 1 CAPSULE(100 MG) BY MOUTH TWICE DAILY  ? glipiZIDE (GLUCOTROL XL) 5 MG 24 hr tablet Take 1 tablet (5 mg total) by mouth daily with  breakfast.  ? ibuprofen (ADVIL) 600 MG tablet Take 1 tablet (600 mg total) by mouth 2 (two) times daily as needed for moderate pain.  ? Insulin Pen Needle (B-D ULTRAFINE III SHORT PEN) 31G X 8 MM MISC 1 each by Does not apply route 4 (four) times daily.  ? meloxicam (MOBIC) 15 MG tablet Take 15 mg by mouth daily.  ? metFORMIN (GLUCOPHAGE-XR) 500 MG 24 hr tablet Take 1 tablet (500 mg total) by mouth daily with breakfast.  ? ONETOUCH ULTRA test strip 1 each by Other route 2 (two) times daily. DX E11.8  ? TRESIBA FLEXTOUCH 100 UNIT/ML FlexTouch Pen SMARTSIG:60 Unit(s) SUB-Q Every Night  ? valACYclovir (VALTREX) 500 MG tablet Take 1 tablet (500 mg total) by mouth daily.  ? Vitamin D, Ergocalciferol, (DRISDOL) 1.25 MG (50000 UNIT) CAPS capsule Take 1 capsule (50,000 Units total) by mouth every 7 (seven) days.  ? [DISCONTINUED] glipiZIDE (GLUCOTROL XL) 5 MG 24 hr tablet Take 1  tablet (5 mg total) by mouth daily with breakfast.  ? [DISCONTINUED] insulin detemir (LEVEMIR) 100 UNIT/ML FlexPen Inject 40 Units into the skin at bedtime.  ? [DISCONTINUED] metFORMIN (GLUCOPHAGE-XR) 500 MG 24 hr tablet Take 1 tablet (500 mg total) by mouth daily with breakfast.  ? [DISCONTINUED] Vitamin D, Ergocalciferol, (DRISDOL) 1.25 MG (50000 UNIT) CAPS capsule Take 1 capsule (50,000 Units total) by mouth every 7 (seven) days. (Patient not taking: Reported on 12/23/2020)  ? ?No facility-administered encounter medications on file as of 04/22/2021.  ? ? ?ALLERGIES: ?No Known Allergies ? ?VACCINATION STATUS: ?Immunization History  ?Administered Date(s) Administered  ? Influenza,inj,Quad PF,6+ Mos 12/02/2019  ? PFIZER(Purple Top)SARS-COV-2 Vaccination 05/03/2019, 05/28/2019, 12/17/2019  ? ? ?Diabetes ?She presents for her follow-up diabetic visit. She has type 2 diabetes mellitus. Onset time: She was diagnosed at approximate age of. Her disease course has been improving. There are no hypoglycemic associated symptoms. Pertinent negatives for  hypoglycemia include no confusion, headaches, pallor or seizures. Associated symptoms include fatigue, foot paresthesias, polydipsia, polyuria and weight loss. Pertinent negatives for diabetes include no polyphagia. There are no hypoglycemic complications. Symptoms are improving. There are no diabetic complications. Risk factors for coronary artery disease include diabetes mellitus, family history and sedentary lifestyle. Current diabetic treatment includes oral agent (dual therapy) and insulin injections. She is compliant with treatment some of the time (has had difficulty affording her diabetes medication). Her weight is fluctuating minimally. She is following a generally healthy diet. When asked about meal planning, she reported none. She has not had a previous visit with a dietitian. She participates in exercise intermittently. Her home blood glucose trend is fluctuating minimally. Her overall blood glucose range is >200 mg/dl. (She presents today with her meter, no logs, showing inconsistent glucose monitoring pattern.  She has had trouble affording her glucose strips and her medications.  Her POCT A1c today is 10%, improving from previous visit of 12.2%.  She has started cutting out animal protein from her diet.  She denies any s/s of hypoglycemia. ?) An ACE inhibitor/angiotensin II receptor blocker is not being taken. She does not see a podiatrist.Eye exam is not current.  ?Hypertension ?This is a chronic problem. The current episode started more than 1 month ago. The problem has been resolved since onset. The problem is controlled. Pertinent negatives include no headaches. There are no associated agents to hypertension. Risk factors for coronary artery disease include diabetes mellitus, dyslipidemia, sedentary lifestyle and stress. Past treatments include nothing. Compliance problems include diet and exercise.   ? ?Review of systems ? ?Constitutional: + stable body weight,  current Body mass index is 27.92  kg/m?. , + fatigue, no subjective hyperthermia, no subjective hypothermia ?Eyes: no blurry vision, no xerophthalmia ?ENT: no sore throat, no nodules palpated in throat, no dysphagia/odynophagia, no hoarseness ?Cardiovascular: no chest pain, no shortness of breath, no palpitations, occasional leg swelling ?Respiratory: no cough, no shortness of breath ?Gastrointestinal: no nausea/vomiting/diarrhea ?Musculoskeletal: no muscle/joint aches ?Skin: no rashes, no hyperemia ?Neurological: no tremors, no dizziness, + numbness/tingling in bilateral feet ?Psychiatric: no depression, no anxiety ? ? ?Objective:  ?  ?BP 137/80   Pulse 82   Ht 5' 6"  (1.676 m)   Wt 173 lb (78.5 kg)   BMI 27.92 kg/m?   ?Wt Readings from Last 3 Encounters:  ?04/22/21 173 lb (78.5 kg)  ?03/17/21 165 lb (74.8 kg)  ?12/23/20 164 lb (74.4 kg)  ?  ? ?BP Readings from Last 3 Encounters:  ?04/22/21 137/80  ?03/17/21  137/83  ?12/23/20 118/79  ? ? ? ?Physical Exam- Limited ? ?Constitutional:  Body mass index is 27.92 kg/m?. , not in acute distress, normal state of mind ?Eyes:  EOMI, no exophthalmos ?Neck: Supple ?Cardiovascular: RRR, no murmurs, rubs, or gallops, no edema ?Respiratory: Adequate breathing efforts, no crackles, rales, rhonchi, or wheezing ?Musculoskeletal: no gross deformities, strength intact in all four extremities, no gross restriction of joint movements ?Skin:  no rashes, no hyperemia ?Neurological: no tremor with outstretched hands ? ? ?CMP ( most recent) ?CMP  ?   ?Component Value Date/Time  ? NA 139 03/25/2021 1635  ? K 4.3 03/25/2021 1635  ? CL 103 03/25/2021 1635  ? CO2 23 03/25/2021 1635  ? GLUCOSE 114 (H) 03/25/2021 1635  ? GLUCOSE 89 11/13/2019 1017  ? BUN 18 03/25/2021 1635  ? CREATININE 0.83 03/25/2021 1635  ? CREATININE 0.83 11/13/2019 1017  ? CALCIUM 9.9 03/25/2021 1635  ? PROT 6.9 03/25/2021 1635  ? ALBUMIN 4.3 03/25/2021 1635  ? AST 8 03/25/2021 1635  ? ALT 11 03/25/2021 1635  ? ALKPHOS 80 03/25/2021 1635  ? BILITOT <0.2  03/25/2021 1635  ? GFRNONAA 85 11/13/2019 1017  ? GFRAA 99 11/13/2019 1017  ? ? ? ?Diabetic Labs (most recent): ?Lab Results  ?Component Value Date  ? HGBA1C 10.0 04/22/2021  ? HGBA1C 12.2 (A) 12/23/2020  ? HGBA1C 9.6

## 2021-05-09 ENCOUNTER — Other Ambulatory Visit: Payer: Self-pay | Admitting: Internal Medicine

## 2021-06-09 ENCOUNTER — Other Ambulatory Visit: Payer: Self-pay | Admitting: Nurse Practitioner

## 2021-07-10 ENCOUNTER — Other Ambulatory Visit: Payer: Self-pay | Admitting: Nurse Practitioner

## 2021-07-12 ENCOUNTER — Other Ambulatory Visit: Payer: Self-pay | Admitting: Internal Medicine

## 2021-07-26 ENCOUNTER — Ambulatory Visit: Payer: PRIVATE HEALTH INSURANCE | Admitting: Nurse Practitioner

## 2021-07-30 ENCOUNTER — Other Ambulatory Visit: Payer: Self-pay | Admitting: Nurse Practitioner

## 2021-08-19 ENCOUNTER — Ambulatory Visit: Payer: PRIVATE HEALTH INSURANCE | Admitting: Nurse Practitioner

## 2021-10-20 ENCOUNTER — Other Ambulatory Visit (HOSPITAL_COMMUNITY): Payer: Self-pay

## 2021-10-22 ENCOUNTER — Other Ambulatory Visit (HOSPITAL_COMMUNITY): Payer: Self-pay

## 2021-11-03 ENCOUNTER — Other Ambulatory Visit (HOSPITAL_COMMUNITY): Payer: Self-pay

## 2021-11-03 ENCOUNTER — Telehealth: Payer: Self-pay

## 2021-11-03 MED ORDER — INSULIN GLARGINE-YFGN 100 UNIT/ML ~~LOC~~ SOPN: 60.0000 [IU] | PEN_INJECTOR | Freq: Every day | SUBCUTANEOUS | 3 refills | Status: DC

## 2021-11-03 NOTE — Telephone Encounter (Signed)
Patient Advocate Encounter   Received notification that prior authorization for Tresiba FlexTouch (insulin degludec injection) 100 Units/mL solution is required/requested.  Per Test Claim: Brandi Oneill is covered, patient copay $0.00 for 30 day supply   PA submitted on 11/03/21 to [place] via CoverMyMeds Key [id number]  Status is pending

## 2021-11-03 NOTE — Telephone Encounter (Signed)
Patient Advocate Encounter  Received notification from [employer] that the request for prior authorization for Tresiba FlexTouch (insulin degludec injection) 100 Units/mL solution has been denied due to Criteria not met. Must try the following formulary drug to meet criteria: Insulin glargine-yfgn.     Test claim for [facility] shows $0.00 copay for patient  Key: [facility]  Denial letter attached to patient's chart

## 2021-11-03 NOTE — Telephone Encounter (Signed)
I sent in new script for [facility]

## 2021-11-17 ENCOUNTER — Other Ambulatory Visit: Payer: Self-pay | Admitting: Internal Medicine

## 2022-02-18 ENCOUNTER — Other Ambulatory Visit: Payer: Self-pay | Admitting: "Endocrinology

## 2022-02-23 ENCOUNTER — Telehealth: Payer: Self-pay

## 2022-02-23 DIAGNOSIS — E1165 Type 2 diabetes mellitus with hyperglycemia: Secondary | ICD-10-CM

## 2022-02-23 MED ORDER — TRESIBA FLEXTOUCH 100 UNIT/ML ~~LOC~~ SOPN: 60.0000 [IU] | PEN_INJECTOR | Freq: Every day | SUBCUTANEOUS | 0 refills | Status: AC

## 2022-02-23 NOTE — Telephone Encounter (Signed)
Pt called stating she has moved out of town and is waiting on appointment to see her new physician. Stated she is completely out of her tresiba and asked if you would approve giving her a month's supply to get her through to her appt.

## 2022-02-23 NOTE — Telephone Encounter (Signed)
Pt made aware, voiced understanding. Rx for tresiba 60 units qhs sent to [facility].

## 2022-02-23 NOTE — Telephone Encounter (Signed)
Yes we can send in 1 month supply to whatever pharmacy she would like to hold her over until she gets appt with her new PCP.

## 2022-03-31 ENCOUNTER — Encounter: Payer: Self-pay | Admitting: Radiology
